# Patient Record
Sex: Female | Born: 1985 | Race: White | Hispanic: No | Smoking: Current every day smoker
Health system: Southern US, Community
[De-identification: ages and names within clinical notes are randomized; demographics above are authoritative.]

## PROBLEM LIST (undated history)

## (undated) ENCOUNTER — Inpatient Hospital Stay (HOSPITAL_COMMUNITY): Payer: Self-pay

## (undated) DIAGNOSIS — B009 Herpesviral infection, unspecified: Secondary | ICD-10-CM

## (undated) DIAGNOSIS — F329 Major depressive disorder, single episode, unspecified: Secondary | ICD-10-CM

## (undated) DIAGNOSIS — R87619 Unspecified abnormal cytological findings in specimens from cervix uteri: Secondary | ICD-10-CM

## (undated) DIAGNOSIS — F32A Depression, unspecified: Secondary | ICD-10-CM

## (undated) DIAGNOSIS — Z309 Encounter for contraceptive management, unspecified: Secondary | ICD-10-CM

## (undated) DIAGNOSIS — R87629 Unspecified abnormal cytological findings in specimens from vagina: Secondary | ICD-10-CM

## (undated) DIAGNOSIS — F419 Anxiety disorder, unspecified: Secondary | ICD-10-CM

## (undated) DIAGNOSIS — IMO0002 Reserved for concepts with insufficient information to code with codable children: Secondary | ICD-10-CM

## (undated) DIAGNOSIS — K219 Gastro-esophageal reflux disease without esophagitis: Secondary | ICD-10-CM

## (undated) DIAGNOSIS — J40 Bronchitis, not specified as acute or chronic: Secondary | ICD-10-CM

## (undated) HISTORY — PX: WISDOM TOOTH EXTRACTION: SHX21

## (undated) HISTORY — DX: Anxiety disorder, unspecified: F41.9

## (undated) HISTORY — DX: Unspecified abnormal cytological findings in specimens from vagina: R87.629

## (undated) HISTORY — DX: Encounter for contraceptive management, unspecified: Z30.9

---
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
## 2007-06-03 ENCOUNTER — Other Ambulatory Visit: Admission: RE | Admit: 2007-06-03 | Discharge: 2007-06-03 | Payer: Self-pay | Admitting: Obstetrics and Gynecology

## 2008-01-11 ENCOUNTER — Other Ambulatory Visit: Admission: RE | Admit: 2008-01-11 | Discharge: 2008-01-11 | Payer: Self-pay | Admitting: Obstetrics and Gynecology

## 2008-01-24 ENCOUNTER — Emergency Department (HOSPITAL_COMMUNITY): Admission: EM | Admit: 2008-01-24 | Discharge: 2008-01-24 | Payer: Self-pay | Admitting: Emergency Medicine

## 2008-09-16 ENCOUNTER — Emergency Department (HOSPITAL_COMMUNITY): Admission: EM | Admit: 2008-09-16 | Discharge: 2008-09-16 | Payer: Self-pay | Admitting: Emergency Medicine

## 2008-12-18 ENCOUNTER — Emergency Department (HOSPITAL_COMMUNITY): Admission: EM | Admit: 2008-12-18 | Discharge: 2008-12-18 | Payer: Self-pay | Admitting: Emergency Medicine

## 2009-01-09 ENCOUNTER — Other Ambulatory Visit: Admission: RE | Admit: 2009-01-09 | Discharge: 2009-01-09 | Payer: Self-pay | Admitting: Obstetrics and Gynecology

## 2009-02-07 ENCOUNTER — Emergency Department (HOSPITAL_COMMUNITY): Admission: EM | Admit: 2009-02-07 | Discharge: 2009-02-07 | Payer: Self-pay | Admitting: Emergency Medicine

## 2010-05-11 ENCOUNTER — Emergency Department (HOSPITAL_COMMUNITY)
Admission: EM | Admit: 2010-05-11 | Discharge: 2010-05-11 | Disposition: A | Discharge status: Home / Self Care | Payer: Medicaid Other | Attending: Emergency Medicine | Admitting: Emergency Medicine

## 2010-05-11 DIAGNOSIS — J020 Streptococcal pharyngitis: Secondary | ICD-10-CM | POA: Insufficient documentation

## 2010-05-11 LAB — RAPID STREP SCREEN (MED CTR MEBANE ONLY): Streptococcus, Group A Screen (Direct): POSITIVE — AB

## 2010-05-29 ENCOUNTER — Other Ambulatory Visit: Payer: Self-pay | Admitting: Adult Health

## 2010-05-29 ENCOUNTER — Other Ambulatory Visit (HOSPITAL_COMMUNITY)
Admission: RE | Admit: 2010-05-29 | Discharge: 2010-05-29 | Disposition: A | Discharge status: Home / Self Care | Payer: Medicaid Other | Source: Ambulatory Visit | Attending: Obstetrics and Gynecology | Admitting: Obstetrics and Gynecology

## 2010-05-29 DIAGNOSIS — Z01419 Encounter for gynecological examination (general) (routine) without abnormal findings: Secondary | ICD-10-CM | POA: Insufficient documentation

## 2010-05-29 DIAGNOSIS — Z113 Encounter for screening for infections with a predominantly sexual mode of transmission: Secondary | ICD-10-CM | POA: Insufficient documentation

## 2010-06-18 LAB — POCT I-STAT, CHEM 8
BUN: 9 mg/dL (ref 6–23)
Glucose, Bld: 95 mg/dL (ref 70–99)
HCT: 41 % (ref 36.0–46.0)
Sodium: 138 mEq/L (ref 135–145)

## 2010-06-22 LAB — URINALYSIS, ROUTINE W REFLEX MICROSCOPIC
Glucose, UA: NEGATIVE mg/dL
Ketones, ur: NEGATIVE mg/dL
Nitrite: NEGATIVE
Protein, ur: NEGATIVE mg/dL
Specific Gravity, Urine: 1.025 (ref 1.005–1.030)

## 2010-06-22 LAB — CBC
Hemoglobin: 13 g/dL (ref 12.0–15.0)
MCHC: 34.6 g/dL (ref 30.0–36.0)
RDW: 13.7 % (ref 11.5–15.5)
WBC: 9.1 10*3/uL (ref 4.0–10.5)

## 2010-06-22 LAB — DIFFERENTIAL: Basophils Absolute: 0.2 10*3/uL — ABNORMAL HIGH (ref 0.0–0.1)

## 2010-06-22 LAB — GLUCOSE, CAPILLARY: Glucose-Capillary: 91 mg/dL (ref 70–99)

## 2010-06-22 LAB — BASIC METABOLIC PANEL
BUN: 6 mg/dL (ref 6–23)
CO2: 28 mEq/L (ref 19–32)
Chloride: 105 mEq/L (ref 96–112)
GFR calc Af Amer: >60 mL/min (ref >60)
Sodium: 138 mEq/L (ref 135–145)

## 2010-06-22 LAB — PREGNANCY, URINE: Preg Test, Ur: NEGATIVE

## 2010-12-16 LAB — URINALYSIS, ROUTINE W REFLEX MICROSCOPIC
Bilirubin Urine: NEGATIVE
Ketones, ur: >80 — AB
Nitrite: NEGATIVE
Protein, ur: 30 — AB
Urobilinogen, UA: 0.2

## 2010-12-16 LAB — URINE MICROSCOPIC-ADD ON

## 2010-12-16 LAB — PREGNANCY, URINE: Preg Test, Ur: NEGATIVE

## 2011-01-02 LAB — OB RESULTS CONSOLE RUBELLA ANTIBODY, IGM: Rubella: IMMUNE

## 2011-01-02 LAB — OB RESULTS CONSOLE ABO/RH: RH Type: POSITIVE

## 2011-01-02 LAB — OB RESULTS CONSOLE RPR: RPR: NONREACTIVE

## 2011-01-18 ENCOUNTER — Emergency Department (HOSPITAL_COMMUNITY)
Admission: EM | Admit: 2011-01-18 | Discharge: 2011-01-18 | Disposition: A | Discharge status: Home / Self Care | Payer: Medicaid Other | Attending: Emergency Medicine | Admitting: Emergency Medicine

## 2011-01-18 DIAGNOSIS — M25559 Pain in unspecified hip: Secondary | ICD-10-CM | POA: Insufficient documentation

## 2011-01-18 DIAGNOSIS — O99891 Other specified diseases and conditions complicating pregnancy: Secondary | ICD-10-CM | POA: Insufficient documentation

## 2011-01-18 DIAGNOSIS — F17200 Nicotine dependence, unspecified, uncomplicated: Secondary | ICD-10-CM | POA: Insufficient documentation

## 2011-01-18 HISTORY — DX: Bronchitis, not specified as acute or chronic: J40

## 2011-01-18 MED ORDER — ACETAMINOPHEN 325 MG PO TABS
650.0000 mg | ORAL_TABLET | Freq: Once | ORAL | Status: AC
  Administered 2011-01-18: 650 mg via ORAL
  Filled 2011-01-18: qty 2

## 2011-01-18 NOTE — ED Notes (Signed)
States is one month and one week pregnant

## 2011-01-18 NOTE — ED Notes (Signed)
Pt presents with left sided hip pain. Pt states she is [redacted] weeks pregnant as well. Pt guarding left hip.

## 2011-01-18 NOTE — ED Provider Notes (Signed)
History     CSN: [id number] Arrival date & time: 01/18/2011 11:01 PM   First MD Initiated Contact with Patient 01/18/11 2304      Chief Complaint  Patient presents with  . Hip Pain    (Consider location/radiation/quality/duration/timing/severity/associated sxs/prior treatment) Patient is a 25 y.o. female presenting with hip pain. The history is provided by the patient.  Hip Pain This is a new problem. Episode onset: few days ago. The problem occurs constantly. The problem has not changed since onset.Pertinent negatives include no chest pain, no abdominal pain and no shortness of breath. The symptoms are aggravated by walking and standing. The symptoms are relieved by nothing.  pt c/o left hip pain for past few days. Constant. Dull. No hx same, x states in past hip occasionally would 'pop'. No hx hip condition/injury. No knee pain. No low back pain. No abdominal pain. No skin changes or erythema. No swelling. No numbness/weakness. No fever or chills. Has taken no meds for same. [redacted] weeks pregnant, +pnc, states normal u/s.   Past Medical History  Diagnosis Date  . Bronchitis     History reviewed. No pertinent past surgical history.  History reviewed. No pertinent family history.  History  Substance Use Topics  . Smoking status: Current Some Day Smoker  . Smokeless tobacco: Not on file  . Alcohol Use: No    OB History    Grav Para Term Preterm Abortions TAB SAB Ect Mult Living   1               Review of Systems  Constitutional: Negative for fever.  HENT: Negative for neck pain.   Respiratory: Negative for shortness of breath.   Cardiovascular: Negative for chest pain.  Gastrointestinal: Negative for abdominal pain.  Genitourinary: Negative for dysuria, flank pain and vaginal bleeding.  Musculoskeletal: Negative for back pain.  Skin: Negative for rash.  Neurological: Negative for weakness and numbness.  Hematological: Negative for adenopathy.    Allergies  Review of  patient's allergies indicates no known allergies.  Home Medications  No current outpatient prescriptions on file.  BP 118/63  Pulse 89  Temp(Src) 98.2 F (36.8 C) (Oral)  Resp 20  Ht 5\' 4"  (1.626 m)  Wt 135 lb (61.236 kg)  BMI 23.17 kg/m2  SpO2 100%  Physical Exam  Nursing note and vitals reviewed. Constitutional: She appears well-developed and well-nourished. No distress.  HENT:  Head: Atraumatic.  Eyes: Conjunctivae are normal. No scleral icterus.  Neck: Neck supple. No tracheal deviation present.  Cardiovascular: Normal rate.   Pulmonary/Chest: Effort normal. No respiratory distress.  Abdominal: Soft. Normal appearance. She exhibits no distension and no mass. There is no tenderness. There is no rebound and no guarding.  Musculoskeletal: She exhibits no edema.       Good rom at left hip and knee without pain. No sts. No focal bony tenderness. Distal pulses palp.   Lymphadenopathy:       No fem/inguinal l/a  Neurological: She is alert.       Motor intact. Steady gait  Skin: Skin is warm and dry. No rash noted.  Psychiatric: She has a normal mood and affect.    ED Course  Procedures (including critical care time)    MDM   No fever. Good rom without pain. Discussed diff dx w pt. Given early pregnancy and benign exam, no hx trauma, etc discussed feel no xray needed currently. Try tylenol prn, rest, plan recheck 2-3 days if symptoms fail to improve/resolve (  sooner if worse, severe pain, fevers, swelling).   Suzi Roots, MD 01/18/11 2325

## 2011-01-19 ENCOUNTER — Encounter (HOSPITAL_COMMUNITY): Payer: Self-pay | Admitting: Emergency Medicine

## 2011-01-19 ENCOUNTER — Emergency Department (HOSPITAL_COMMUNITY)
Admission: EM | Admit: 2011-01-19 | Discharge: 2011-01-20 | Disposition: A | Discharge status: Home / Self Care | Payer: Medicaid Other | Attending: Emergency Medicine | Admitting: Emergency Medicine

## 2011-01-19 DIAGNOSIS — M25559 Pain in unspecified hip: Secondary | ICD-10-CM | POA: Insufficient documentation

## 2011-01-19 DIAGNOSIS — O2340 Unspecified infection of urinary tract in pregnancy, unspecified trimester: Secondary | ICD-10-CM

## 2011-01-19 DIAGNOSIS — R109 Unspecified abdominal pain: Secondary | ICD-10-CM | POA: Insufficient documentation

## 2011-01-19 DIAGNOSIS — O2390 Unspecified genitourinary tract infection in pregnancy, unspecified trimester: Secondary | ICD-10-CM | POA: Insufficient documentation

## 2011-01-19 DIAGNOSIS — O210 Mild hyperemesis gravidarum: Secondary | ICD-10-CM | POA: Insufficient documentation

## 2011-01-19 DIAGNOSIS — O99891 Other specified diseases and conditions complicating pregnancy: Secondary | ICD-10-CM | POA: Insufficient documentation

## 2011-01-19 DIAGNOSIS — N390 Urinary tract infection, site not specified: Secondary | ICD-10-CM | POA: Insufficient documentation

## 2011-01-19 LAB — URINALYSIS, ROUTINE W REFLEX MICROSCOPIC
Bilirubin Urine: NEGATIVE
Glucose, UA: NEGATIVE mg/dL
Ketones, ur: NEGATIVE mg/dL
Nitrite: POSITIVE — AB
Urobilinogen, UA: 0.2 mg/dL (ref 0.0–1.0)

## 2011-01-19 LAB — URINE MICROSCOPIC-ADD ON

## 2011-01-19 LAB — WET PREP, GENITAL: Trich, Wet Prep: NONE SEEN

## 2011-01-19 LAB — POCT PREGNANCY, URINE: Preg Test, Ur: POSITIVE

## 2011-01-19 MED ORDER — CEPHALEXIN 500 MG PO CAPS: 500.0000 mg | ORAL_CAPSULE | Freq: Four times a day (QID) | ORAL | Status: AC

## 2011-01-19 MED ORDER — CEPHALEXIN 500 MG PO CAPS
500.0000 mg | ORAL_CAPSULE | Freq: Once | ORAL | Status: AC
  Administered 2011-01-20: 500 mg via ORAL
  Filled 2011-01-19: qty 1

## 2011-01-19 MED ORDER — ACETAMINOPHEN 325 MG PO TABS
650.0000 mg | ORAL_TABLET | Freq: Once | ORAL | Status: AC
  Administered 2011-01-20: 650 mg via ORAL
  Filled 2011-01-19: qty 2

## 2011-01-19 NOTE — ED Notes (Signed)
Pt states she is [redacted] weeks pregnant and started cramping this evening.

## 2011-01-19 NOTE — ED Notes (Signed)
Set up pelvic exam and changed bed out for pelvic bed, notified Dr.

## 2011-01-19 NOTE — ED Provider Notes (Signed)
History  Scribed for Tracy Gaskins, MD, the patient was seen in room APA01. This chart was scribed by Hillery Hunter.   CSN: [id number] Arrival date & time: 01/19/2011 10:02 PM   First MD Initiated Contact with Patient 01/19/11 2215      Chief Complaint  Patient presents with  . Abdominal Cramping    Patient is a 25 y.o. female presenting with cramps. The history is provided by the patient.  Abdominal Cramping The primary symptoms of the illness include abdominal pain (cramping), vomiting and vaginal discharge (during pregnancy). The primary symptoms of the illness do not include fever, shortness of breath, dysuria or vaginal bleeding.  The vaginal discharge is not associated with dysuria.  Symptoms associated with the illness do not include hematuria or back pain.   Tracy LUNDBLAD is a 25 y.o. female who presents to the Emergency Department complaining of abdominal cramping with current pregnancy. She states that she is [redacted] weeks pregnant with her first pregnancy and began experiencing abdominal cramping about 3.5 hours ago. She states the pain began in her lower abdomen and has moved superiorly to periumbilical region. She denies associated vaginal bleeding, dysuria and fever. She sees [facility] and had an ultrasound 3 weeks ago that showed live IUP. She denies prior abdominal surgeries and has a medical history only significant for bronchitis.  Time seen: 22:26   Past Medical History  Diagnosis Date  . Bronchitis      History  Substance Use Topics  . Smoking status: Current Some Day Smoker  . Smokeless tobacco: Not on file  . Alcohol Use: No    OB History    Grav Para Term Preterm Abortions TAB SAB Ect Mult Living   1               Review of Systems  Constitutional: Negative for fever.  Respiratory: Negative for shortness of breath.   Cardiovascular: Negative for chest pain.  Gastrointestinal: Positive for vomiting and  abdominal pain (cramping).  Genitourinary: Positive for vaginal discharge (during pregnancy). Negative for dysuria, hematuria and vaginal bleeding.  Musculoskeletal: Negative for back pain.       Pain in left hip for which she was seen here yesterday and is still present  All other systems reviewed and are negative.    Allergies  Review of patient's allergies indicates no known allergies.  Home Medications  No current outpatient prescriptions on file.  Triage Vitals: BP 108/53  Pulse 76  Temp(Src) 97.9 F (36.6 C) (Oral)  Resp 20  Ht 5\' 5"  (1.651 m)  Wt 136 lb (61.689 kg)  BMI 22.63 kg/m2  SpO2 100%  Physical Exam  CONSTITUTIONAL: Well developed/well nourished HEAD AND FACE: Normocephalic/atraumatic EYES: EOMI/PERRL ENMT: Mucous membranes moist NECK: supple no meningeal signs CV: S1/S2 noted, no murmurs/rubs/gallops noted LUNGS: Lungs are clear to auscultation bilaterally, no apparent distress ABDOMEN: soft, nontender, no rebound or guarding, abdominal ultrasound at bedside shows live IUP: 9 weeks 1 day, fetal heart tones present GU:no cva tenderness, no vaginal discharge, no vaginal bleeding, no cmt, chaperone present NEURO: Pt is awake/alert, moves all extremitiesx4 EXTREMITIES: pulses normal, full ROM,no tenderness noted SKIN: warm, color normal PSYCH: no abnormalities of mood noted   ED Course  [place] bedside Performed by: Tracy Choi Authorized by: Tracy Choi Consent: Verbal consent obtained. Risks and benefits: risks, benefits and alternatives were discussed Consent given by: patient Patient understanding: patient states understanding of the procedure being performed Patient  identity confirmed: verbally with patient and arm band Time out: Immediately prior to procedure a "time out" was called to verify the correct patient, procedure, equipment, support staff and site/side marked as required. Patient tolerance: Patient tolerated the procedure well  with no immediate complications. Comments: Transabdominal Ultrasound Indication - evaluate for IUP, abdominal pain +IUP noted with +cardiac activity CRL shows 9 weeks 1 day         Labs Reviewed  URINALYSIS, ROUTINE W REFLEX MICROSCOPIC  POCT PREGNANCY, URINE   Results for orders placed during the hospital encounter of 01/19/11  URINALYSIS, ROUTINE W REFLEX MICROSCOPIC      Component Value Range   Color, Urine YELLOW  YELLOW    Appearance CLEAR  CLEAR    Specific Gravity, Urine 1.025  1.005 - 1.030    pH 6.0  5.0 - 8.0    Glucose, UA NEGATIVE  NEGATIVE (mg/dL)   Hgb urine dipstick NEGATIVE  NEGATIVE    Bilirubin Urine NEGATIVE  NEGATIVE    Ketones, ur NEGATIVE  NEGATIVE (mg/dL)   Protein, ur NEGATIVE  NEGATIVE (mg/dL)   Urobilinogen, UA 0.2  0.0 - 1.0 (mg/dL)   Nitrite POSITIVE (*) NEGATIVE    Leukocytes, UA NEGATIVE  NEGATIVE   POCT PREGNANCY, URINE      Component Value Range   Preg Test, Ur POSITIVE    URINE MICROSCOPIC-ADD ON      Component Value Range   Squamous Epithelial / LPF FEW (*) RARE    WBC, UA 3-6  <3 (WBC/hpf)   Bacteria, UA MANY (*) RARE    Urine-Other AMORPHOUS URATES/PHOSPHATES        MDM  Nursing notes reviewed and considered in documentation All labs/vitals reviewed and considered Previous records reviewed and considered  Pt well appearing, abd soft and no focal tenderness Pt reports she is approximately 10 wks by [place] by her OBGYN and by due date Bedside US here confirms this Suspicion for acute abd/gyn process is low She has f/u in 24 hours Will start abx for uti   I personally performed the services described in this documentation, which was scribed in my presence. The recorded information has been reviewed and considered.        Tracy Gaskins, MD 01/19/11 [phone]

## 2011-01-21 LAB — GC/CHLAMYDIA PROBE AMP, GENITAL
Chlamydia, DNA Probe: NEGATIVE
GC Probe Amp, Genital: NEGATIVE

## 2011-03-17 NOTE — L&D Delivery Note (Cosign Needed)
Delivery Note At 8:14 AM a viable female was delivered via Vaginal, Spontaneous Delivery (Presentation: Right Occiput Anterior).  APGAR: 9, 9; weight pending. Placenta status: Intact, Spontaneous, calcification.  Cord: 3 vessels with the following complications: None  Anesthesia: Epidural  Episiotomy: None Lacerations: Diffuse skin tears Suture Repair: 3.0 vicryl placed for point bleeding Est. Blood Loss (mL): 300  Mom to postpartum.  Baby to nursery-stable.  Clancy Gourd 08/27/2011, 8:34 AM  Supervised by Dorathy Kinsman

## 2011-03-26 ENCOUNTER — Emergency Department (HOSPITAL_COMMUNITY)
Admission: EM | Admit: 2011-03-26 | Discharge: 2011-03-26 | Disposition: A | Discharge status: Home / Self Care | Payer: Medicaid Other | Attending: Emergency Medicine | Admitting: Emergency Medicine

## 2011-03-26 ENCOUNTER — Encounter (HOSPITAL_COMMUNITY): Payer: Self-pay | Admitting: Emergency Medicine

## 2011-03-26 DIAGNOSIS — R109 Unspecified abdominal pain: Secondary | ICD-10-CM | POA: Insufficient documentation

## 2011-03-26 DIAGNOSIS — O9989 Other specified diseases and conditions complicating pregnancy, childbirth and the puerperium: Secondary | ICD-10-CM | POA: Insufficient documentation

## 2011-03-26 LAB — URINE CULTURE
Colony Count: NO GROWTH
Culture  Setup Time: 201301110321

## 2011-03-26 LAB — URINALYSIS, ROUTINE W REFLEX MICROSCOPIC
Glucose, UA: NEGATIVE mg/dL
Leukocytes, UA: NEGATIVE
Nitrite: NEGATIVE
Specific Gravity, Urine: >1.03 — ABNORMAL HIGH (ref 1.005–1.030)
pH: 6 (ref 5.0–8.0)

## 2011-03-26 MED ORDER — ACETAMINOPHEN 325 MG PO TABS
650.0000 mg | ORAL_TABLET | Freq: Once | ORAL | Status: AC
  Administered 2011-03-26: 650 mg via ORAL
  Filled 2011-03-26: qty 2
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
## 2011-03-26 NOTE — Discharge Instructions (Signed)
Abdominal (belly) pain can be caused by many things. any cases can be observed and treated at home after initial evaluation in the emergency department. Even though you are being discharged home, abdominal pain can be unpredictable. Therefore, you need a repeated exam if your pain does not resolve, returns, or worsens. Most patients with abdominal pain don't have to be admitted to the hospital or have surgery, but serious problems like appendicitis and gallbladder attacks can start out as nonspecific pain. Many abdominal conditions cannot be diagnosed in one visit, so follow-up evaluations are very important. SEEK IMMEDIATE MEDICAL ATTENTION IF: The pain does not go away or becomes severe, particularly over the next 8-12 hours.  A temperature above 100.60F develops.  Repeated vomiting occurs (multiple episodes).  The pain becomes localized to portions of the abdomen. The right side could possibly be appendicitis. In an adult, the left lower portion of the abdomen could be colitis or diverticulitis.  Blood is being passed in stools or vomit (bright red or black tarry stools).  Return also if you develop chest pain, difficulty breathing, dizziness or fainting, or become confused, poorly responsive, or inconsolable (young children).

## 2011-07-29 ENCOUNTER — Inpatient Hospital Stay (HOSPITAL_COMMUNITY): Payer: Medicaid Other

## 2011-07-29 ENCOUNTER — Encounter (HOSPITAL_COMMUNITY): Payer: Self-pay | Admitting: *Deleted

## 2011-07-29 ENCOUNTER — Observation Stay (HOSPITAL_COMMUNITY)
Admission: AD | Admit: 2011-07-29 | Discharge: 2011-07-30 | Disposition: A | Discharge status: Home / Self Care | Payer: Medicaid Other | Source: Ambulatory Visit | Attending: Obstetrics & Gynecology | Admitting: Obstetrics & Gynecology

## 2011-07-29 DIAGNOSIS — O4693 Antepartum hemorrhage, unspecified, third trimester: Secondary | ICD-10-CM | POA: Diagnosis present

## 2011-07-29 DIAGNOSIS — O4690 Antepartum hemorrhage, unspecified, unspecified trimester: Secondary | ICD-10-CM

## 2011-07-29 LAB — CBC
HCT: 35.3 % — ABNORMAL LOW (ref 36.0–46.0)
MCH: 29.3 pg (ref 26.0–34.0)
MCV: 88.3 fL (ref 78.0–100.0)
Platelets: 162 10*3/uL (ref 150–400)
RDW: 13.6 % (ref 11.5–15.5)
WBC: 12.2 10*3/uL — ABNORMAL HIGH (ref 4.0–10.5)

## 2011-07-29 MED ORDER — PRENATAL MULTIVITAMIN CH: 1.0000 | ORAL_TABLET | Freq: Every day | ORAL | Status: DC

## 2011-07-29 MED ORDER — ACETAMINOPHEN 325 MG PO TABS
650.0000 mg | ORAL_TABLET | ORAL | Status: DC | PRN
  Administered 2011-07-29: 650 mg via ORAL
  Filled 2011-07-29: qty 2

## 2011-07-29 MED ORDER — DOCUSATE SODIUM 100 MG PO CAPS: 100.0000 mg | ORAL_CAPSULE | Freq: Every day | ORAL | Status: DC

## 2011-07-29 MED ORDER — ZOLPIDEM TARTRATE 10 MG PO TABS: 10.0000 mg | ORAL_TABLET | Freq: Every evening | ORAL | Status: DC | PRN

## 2011-07-29 MED ORDER — CALCIUM CARBONATE ANTACID 500 MG PO CHEW
2.0000 | CHEWABLE_TABLET | ORAL | Status: DC | PRN
  Administered 2011-07-30: 400 mg via ORAL
  Filled 2011-07-29: qty 2

## 2011-07-29 MED ORDER — PANTOPRAZOLE SODIUM 40 MG PO TBEC
40.0000 mg | DELAYED_RELEASE_TABLET | Freq: Every day | ORAL | Status: DC
  Administered 2011-07-29: 40 mg via ORAL
  Filled 2011-07-29 (×2): qty 1

## 2011-07-29 MED ORDER — ACYCLOVIR 200 MG PO CAPS
400.0000 mg | ORAL_CAPSULE | Freq: Two times a day (BID) | ORAL | Status: DC
  Administered 2011-07-29: 400 mg via ORAL
  Filled 2011-07-29 (×2): qty 2

## 2011-07-29 NOTE — MAU Provider Note (Signed)
Chief Complaint:  Vaginal Bleeding   First Provider Initiated Contact with Patient 07/29/11 1457      HPI  Tracy Choi is a 26 y.o. G1P0 at [redacted]w[redacted]d presenting with bright red vaginal bleeding starting following intercourse this morning and increasing over the day.  She reports good fetal movement, denies LOF, vaginal itching/burning, urinary symptoms, h/a, dizziness, n/v, or fever/chills.    Pregnancy Course: uncomplicated  Past Medical History: Past Medical History  Diagnosis Date  . Bronchitis   . No pertinent past medical history     Past Surgical History: Past Surgical History  Procedure Date  . No past surgeries     Family History: Family History  Problem Relation Age of Onset  . Anesthesia problems Neg Hx     Social History: History  Substance Use Topics  . Smoking status: Current Some Day Smoker    Types: Cigarettes  . Smokeless tobacco: Not on file  . Alcohol Use: No    Allergies:  Allergies  Allergen Reactions  . Peanut-Containing Drug Products Anaphylaxis  . Vicodin (Hydrocodone-Acetaminophen) Nausea And Vomiting    Patient states that she gets sick to her stomach when medicine is "getting out of her system"    Meds:  Prescriptions prior to admission  Medication Sig Dispense Refill  . acyclovir (ZOVIRAX) 400 MG tablet Take 400 mg by mouth 2 (two) times daily.      Marland Kitchen albuterol (VENTOLIN HFA) 108 (90 BASE) MCG/ACT inhaler Inhale 2 puffs into the lungs every 6 (six) hours as needed. For asthmatic bronchitis      . omeprazole (PRILOSEC) 20 MG capsule Take 20 mg by mouth at bedtime.      . Prenatal Vit-Fe Fumarate-FA (PRENATAL MULTIVITAMIN) TABS Take 1 tablet by mouth every morning.          Physical Exam  Blood pressure 114/64, pulse 103, resp. rate 18, height 5\' 5"  (1.651 m), weight 73.483 kg (162 lb). GENERAL: Well-developed, well-nourished female in no acute distress.  HEENT: normocephalic, good dentition HEART: normal rate RESP: normal  effort ABDOMEN: Soft, nontender, nondistended, gravid.  EXTREMITIES: Nontender, no edema NEURO: alert and oriented  SPECULUM EXAM: Pelvic exam: Cervix pink, without lesion, scant brown discharge, no active bleeding seen, vaginal walls and external genitalia without lesion Cervix 1/40/-3, soft, anterior  Dilation: 1 Effacement (%): 40 Cervical Position: Anterior Station: -2 Presentation: Vertex Exam by:: Sharen Counter, CNM  No blood on glove following cervical exam  FHT:  Baseline 135 , moderate variability, accelerations present, no decelerations Contractions: occasional irritability   Labs: Results for orders placed during the hospital encounter of 07/29/11 (from the past 24 hour(s))  CBC     Status: Abnormal   Collection Time   07/29/11  4:41 PM      Component Value Range   WBC 12.2 (*) 4.0 - 10.5 (K/uL)   RBC 4.00  3.87 - 5.11 (MIL/uL)   Hemoglobin 11.7 (*) 12.0 - 15.0 (g/dL)   HCT 40.9 (*) 81.1 - 46.0 (%)   MCV 88.3  78.0 - 100.0 (fL)   MCH 29.3  26.0 - 34.0 (pg)   MCHC 33.1  30.0 - 36.0 (g/dL)   RDW 91.4  78.2 - 95.6 (%)   Platelets 162  150 - 400 (K/uL)     Imaging:    Assessment: Vaginal bleeding in pregnancy   Plan: Discussed assessment with Dr Debroah Loop Limited U/S to evaluate placenta and for AFI  Admit for observation to antepartum See H&P  LEFTWICH-KIRBY,  Meri Pelot 5/15/20133:15 PM

## 2011-07-29 NOTE — H&P (Signed)
Tracy Choi is a 26 y.o. female presenting with vaginal bleeding at term.  She reports spotting after intercourse this morning, then heavier bleeding, soaking a pad with bright red blood before coming in to MAU and arriving in MAU with pad 1/2 soaked with bright red blood.  She reports good fetal movement, denies regular contractions, LOF, vaginal itching/burning, urinary symptoms, h/a, dizziness, n/v, or fever/chills.    Maternal Medical History:  Reason for admission: Reason for admission: vaginal bleeding.  Reason for Admission:   nauseaFetal activity: Perceived fetal activity is normal.   Last perceived fetal movement was within the past hour.    Prenatal complications: Bleeding.   Prenatal Complications - Diabetes: none. 2 hour GTT 105, 94, 91  OB History    Grav Para Term Preterm Abortions TAB SAB Ect Mult Living   1 0 0 0 0 0 0 0 0 0      Past Medical History  Diagnosis Date  . Bronchitis   . No pertinent past medical history    Past Surgical History  Procedure Date  . No past surgeries    Family History: family history is negative for Anesthesia problems. Social History:  reports that she has been smoking Cigarettes.  She does not have any smokeless tobacco history on file. She reports that she does not drink alcohol or use illicit drugs.  Review of Systems  Constitutional: Negative for fever, chills and malaise/fatigue.  Eyes: Negative for blurred vision.  Respiratory: Negative for cough and shortness of breath.   Cardiovascular: Negative for chest pain.  Gastrointestinal: Negative for heartburn, nausea, vomiting and abdominal pain.  Genitourinary: Negative for dysuria, urgency and frequency.  Musculoskeletal: Negative.   Neurological: Negative for dizziness and headaches.  Psychiatric/Behavioral: Negative for depression.     SPECULUM EXAM: Pelvic exam: Cervix pink, without lesion, scant brown discharge, no active bleeding seen, vaginal walls and external  genitalia without lesion Cervix 1/40/-3, soft, anterior  No bleeding on glove following cervical exam Dilation: 1 Effacement (%): 40 Station: -2 Exam by:: Sharen Counter, CNM Blood pressure 114/64, pulse 103, resp. rate 18, height 5\' 5"  (1.651 m), weight 73.483 kg (162 lb). Maternal Exam:  Uterine Assessment: Contraction strength is mild.  Contraction frequency is irregular.   Abdomen: Patient reports no abdominal tenderness. Fetal presentation: vertex  Pelvis: adequate for delivery.   Cervix: Cervix evaluated by digital exam.     Fetal Exam Fetal Monitor Review: Mode: ultrasound.   Baseline rate: 135.  Variability: moderate (6-25 bpm).   Pattern: accelerations present and no decelerations.    Fetal State Assessment: Category I - tracings are normal.     Physical Exam  Nursing note and vitals reviewed. Constitutional: She is oriented to person, place, and time. She appears well-developed and well-nourished.  Neck: Normal range of motion.  Cardiovascular: Normal rate, regular rhythm and normal heart sounds.   Respiratory: Effort normal and breath sounds normal.  GI: Soft.  Musculoskeletal: Normal range of motion.  Neurological: She is alert and oriented to person, place, and time. She has normal reflexes.  Skin: Skin is warm and dry.  Psychiatric: She has a normal mood and affect. Her behavior is normal. Judgment and thought content normal.    Prenatal labs: ABO, Rh:  O pos Antibody:  neg Rubella:  immune RPR:   neg HBsAg:   neg HIV:   neg GBS:   unknown--collected 07/28/11  Assessment/Plan: Vaginal bleeding at term  Admit to antepartum for observation per Dr Debroah Loop Continuous  fetal monitoring/toco  LEFTWICH-KIRBY, Seraphina Mitchner 07/29/2011, 5:03 PM

## 2011-07-29 NOTE — MAU Note (Signed)
Pt in due to vaginal bleeding. Had intercourse this am and started bleeding during intercourse around 1000.  Has continued to bleed, saturated 1 pad at home, 1 seen in mau had moderate amount of darker red blood on pad.  New pad given in triage.  Reports abdominal cramping that started with bleeding.  + FM

## 2011-07-30 NOTE — Discharge Summary (Signed)
Obstetric Discharge Summary Reason for Admission: bleeding Prenatal Procedures: none Intrapartum Procedures: na Postpartum Procedures: none Complications-Operative and Postpartum: none Hemoglobin  Date Value Range Status  07/29/2011 11.7* 12.0-15.0 (g/dL) Final     HCT  Date Value Range Status  07/29/2011 35.3* 36.0-46.0 (%) Final    Physical Exam:  General: alert and cooperative Uterine Fundus: non tender DVT Evaluation: No evidence of DVT seen on physical exam.  Discharge Diagnoses: Antepartum bleeding  Discharge Information: Date: 07/30/2011 Activity: pelvic rest Diet: routine Medications: None Condition: stable Instructions: refer to practice specific booklet Discharge to: home Follow-up Information    Follow up with [facility] on 08/04/2011.   Contact information:   [address] [place] [address]          Lazaro Arms 07/30/2011, 7:39 AM

## 2011-07-30 NOTE — Discharge Instructions (Signed)
Vaginal Bleeding During Pregnancy, Third Trimester A small amount of bleeding (spotting) is relatively common in pregnancy. Sometimes bleeding may be "normal." Bleeding in the third trimester can be very serious for the mother and the baby, and should be reported to your caregiver right away. It is very important to follow your caregiver's instructions. CAUSES Possible causes of bleeding during the third trimester:  The placenta may be partially covering or completely covering the opening to the cervix (placenta previa).   The placenta may have separated from the uterus (abruption of the placenta).   There may be an infection or growth on the cervix.   You may be starting labor, called discharging of the mucus plug.   The placenta may grow into the muscle layer of the uterus (placenta accreta).  DIAGNOSIS  Your caregiver may do:  Pelvic exam in the delivery room, called a double set up (being ready to do an emergency cesarean delivery, if necessary).   Blood tests, to see if you are anemic (not having enough red blood cells).   Ultrasound and fetal monitoring, to see if the baby is having problems.   Ultrasound, to find out why you are bleeding.  TREATMENT   You may need to remain in the hospital.   You may be given an IV (intravenous) while you are in the hospital.   You may be put on strict bed rest.   You may need a blood transfusion.   You may be placed on oxygen.   The baby may need to be delivered immediately.  HOME CARE INSTRUCTIONS   Your caregiver may order bed rest (getting up to go to the bathroom only). At this time, you may need to make arrangements for the care of children and for other responsibilities.   Keep track of the number of pads you use each day and how soaked (saturated) they are. Write this down.   Do not use tampons. Do not douche.   Do not have sexual intercourse or any sexual activity that may cause an orgasm, until approved by your caregiver.    Follow your caregiver's advice about lifting, driving, physical and social activities.   Eat a balanced and nutritious diet.   Get plenty of rest and sleep.   Do not drink alcohol or smoke.  SEEK IMMEDIATE MEDICAL CARE IF:   You experience severe cramps or pain in your back or belly (abdomen).   You have an oral temperature above 102 F (38.9 C), not controlled by medicine.   You develop chills.   You have a gush of fluid from the vagina.   You pass large clots or tissue. Save any tissue for your caregiver to inspect.   Your bleeding increases or you become light-headed or weak.   You pass out.   You feel less movement or no movement of the baby.  Document Released: 05/23/2002 Document Revised: 02/19/2011 Document Reviewed: 01/28/2009 Exit[employer] Patient Information 2012 [employer]. 

## 2011-08-01 NOTE — H&P (Signed)
Agree with note. 

## 2011-08-03 NOTE — Progress Notes (Signed)
Post discharge review completed. 

## 2011-08-09 ENCOUNTER — Inpatient Hospital Stay (HOSPITAL_COMMUNITY)
Admission: AD | Admit: 2011-08-09 | Discharge: 2011-08-09 | Disposition: A | Discharge status: Hospice | Payer: Medicaid Other | Source: Ambulatory Visit | Attending: Obstetrics & Gynecology | Admitting: Obstetrics & Gynecology

## 2011-08-09 ENCOUNTER — Encounter (HOSPITAL_COMMUNITY): Payer: Self-pay | Admitting: *Deleted

## 2011-08-09 DIAGNOSIS — O479 False labor, unspecified: Secondary | ICD-10-CM | POA: Insufficient documentation

## 2011-08-09 HISTORY — DX: Major depressive disorder, single episode, unspecified: F32.9

## 2011-08-09 HISTORY — DX: Gastro-esophageal reflux disease without esophagitis: K21.9

## 2011-08-09 HISTORY — DX: Depression, unspecified: F32.A

## 2011-08-09 HISTORY — DX: Reserved for concepts with insufficient information to code with codable children: IMO0002

## 2011-08-09 HISTORY — DX: Herpesviral infection, unspecified: B00.9

## 2011-08-09 HISTORY — DX: Unspecified abnormal cytological findings in specimens from cervix uteri: R87.619

## 2011-08-09 NOTE — MAU Note (Signed)
Pt states her contractions started @ 1900 and are anywhere from 10-20 min apart and rates them a "5" on the pain scale.  Denies any bleeding or leaking.

## 2011-08-09 NOTE — Discharge Instructions (Signed)

## 2011-08-21 ENCOUNTER — Telehealth (HOSPITAL_COMMUNITY): Payer: Self-pay | Admitting: *Deleted

## 2011-08-21 ENCOUNTER — Encounter (HOSPITAL_COMMUNITY): Payer: Self-pay | Admitting: *Deleted

## 2011-08-21 NOTE — Telephone Encounter (Signed)
Preadmission screen  

## 2011-08-24 ENCOUNTER — Encounter: Payer: Self-pay | Admitting: Obstetrics and Gynecology

## 2011-08-24 ENCOUNTER — Other Ambulatory Visit: Payer: Self-pay | Admitting: Obstetrics and Gynecology

## 2011-08-24 NOTE — H&P (Signed)
  Tracy Choi is a 26 y.o. female presenting for Induction of labor due to postdates pregnancy. History OB History    Grav Para Term Preterm Abortions TAB SAB Ect Mult Living   1 0 0 0 0 0 0 0 0 0      Past Medical History  Diagnosis Date  . Bronchitis   . No pertinent past medical history   . Herpes     do not let any family members know of the HSV  . Depression   . Asthma   . GERD (gastroesophageal reflux disease)   . Abnormal Pap smear    Past Surgical History  Procedure Date  . No past surgeries   . Wisdom tooth extraction    Family History: family history includes Alcohol abuse in her mother; Cancer in her mother and paternal grandmother; Diabetes in her maternal uncle; Heart disease in her maternal grandfather, maternal grandmother, and maternal uncle; Hypertension in her maternal aunt, maternal grandfather, maternal grandmother, maternal uncle, mother, paternal aunt, paternal grandfather, paternal grandmother, and paternal uncle; and Stroke in her maternal grandfather and maternal grandmother.  There is no history of Anesthesia problems. Social History:  reports that she has been smoking Cigarettes.  She has a 5 pack-year smoking history. She has quit using smokeless tobacco. She reports that she does not drink alcohol or use illicit drugs.  ROSno acute concerns    There were no vitals taken for this visit. Exam Physical Exam Physical Examination: General appearance - alert, well appearing, and in no distress, oriented to person, place, and time, normal appearing weight and BP 142/88, Wt 160.6 lb(22.6 lb gain this pregnancy) Mental status - alert, oriented to person, place, and time, normal mood, behavior, speech, dress, motor activity, and thought processes Chest - clear to auscultation, no wheezes, rales or rhonchi, symmetric air entry Heart - normal rate and regular rhythm Abdomen - soft, nontender, nondistended, no masses or organomegaly Gravid uterus 36-37 cm  fundal Ht. Pelvic - normal external genitalia, vulva, vagina, cervix, uterus and adnexa, CERVIX: normal appearing cervix without discharge or lesions, Cx: 2.5/80/soft/vtx/ -2-3 , UTERUS: enlarged to 40 week's size Extremities - peripheral pulses normal, no pedal edema, no clubbing or cyanosis, pedal edema 1 +  Prenatal labs: ABO, Rh: --/--/O POS (05/15 1647) Antibody: Negative (10/19 0000) Rubella: Immune (10/19 0000) RPR: Nonreactive (10/19 0000)  HBsAg: Negative (10/19 0000)  HIV: Non-reactive (10/19 0000)  GBS: Negative (05/15 0000)  Down synd Risk initially considered Increased with DSR 1:52, with [facility] Testing less than 1:`10,000 for T-21, T-18, T-13. Assessment/Plan: Pregnancy 41.0wks, admitted for Induction of Labor for postdates.   Myron Stankovich V 08/24/2011, 10:57 PM

## 2011-08-25 ENCOUNTER — Inpatient Hospital Stay (HOSPITAL_COMMUNITY)
Admission: RE | Admit: 2011-08-25 | Discharge: 2011-08-28 | DRG: 775 | Disposition: A | Discharge status: Home / Self Care | Payer: Medicaid Other | Source: Ambulatory Visit | Attending: Family Medicine | Admitting: Family Medicine

## 2011-08-25 ENCOUNTER — Encounter (HOSPITAL_COMMUNITY): Payer: Self-pay

## 2011-08-25 DIAGNOSIS — O480 Post-term pregnancy: Principal | ICD-10-CM | POA: Diagnosis present

## 2011-08-25 LAB — CBC
MCV: 85.7 fL (ref 78.0–100.0)
Platelets: 211 10*3/uL (ref 150–400)
RBC: 4.68 MIL/uL (ref 3.87–5.11)
WBC: 14.3 10*3/uL — ABNORMAL HIGH (ref 4.0–10.5)

## 2011-08-25 MED ORDER — OXYTOCIN 20 UNITS IN LACTATED RINGERS INFUSION - SIMPLE
1.0000 m[IU]/min | INTRAVENOUS | Status: DC
  Filled 2011-08-25: qty 1000

## 2011-08-25 MED ORDER — LACTATED RINGERS IV SOLN
500.0000 mL | INTRAVENOUS | Status: DC | PRN
  Administered 2011-08-27: 500 mL via INTRAVENOUS

## 2011-08-25 MED ORDER — TERBUTALINE SULFATE 1 MG/ML IJ SOLN: 0.2500 mg | Freq: Once | INTRAMUSCULAR | Status: AC | PRN

## 2011-08-25 MED ORDER — OXYTOCIN BOLUS FROM INFUSION
500.0000 mL | Freq: Once | INTRAVENOUS | Status: AC
  Administered 2011-08-27: 500 mL via INTRAVENOUS
  Filled 2011-08-25: qty 500

## 2011-08-25 MED ORDER — LIDOCAINE HCL (PF) 1 % IJ SOLN
30.0000 mL | INTRAMUSCULAR | Status: DC | PRN
  Filled 2011-08-25: qty 30

## 2011-08-25 MED ORDER — ONDANSETRON HCL 4 MG/2ML IJ SOLN: 4.0000 mg | Freq: Four times a day (QID) | INTRAMUSCULAR | Status: DC | PRN

## 2011-08-25 MED ORDER — ACETAMINOPHEN 325 MG PO TABS: 650.0000 mg | ORAL_TABLET | ORAL | Status: DC | PRN

## 2011-08-25 MED ORDER — CITRIC ACID-SODIUM CITRATE 334-500 MG/5ML PO SOLN: 30.0000 mL | ORAL | Status: DC | PRN

## 2011-08-25 MED ORDER — OXYCODONE-ACETAMINOPHEN 5-325 MG PO TABS: 1.0000 | ORAL_TABLET | ORAL | Status: DC | PRN

## 2011-08-25 MED ORDER — FLEET ENEMA 7-19 GM/118ML RE ENEM: 1.0000 | ENEMA | RECTAL | Status: DC | PRN

## 2011-08-25 MED ORDER — IBUPROFEN 600 MG PO TABS: 600.0000 mg | ORAL_TABLET | Freq: Four times a day (QID) | ORAL | Status: DC | PRN

## 2011-08-25 MED ORDER — OXYTOCIN 20 UNITS IN LACTATED RINGERS INFUSION - SIMPLE: 125.0000 mL/h | Freq: Once | INTRAVENOUS | Status: DC

## 2011-08-25 MED ORDER — ZOLPIDEM TARTRATE 10 MG PO TABS
10.0000 mg | ORAL_TABLET | Freq: Every evening | ORAL | Status: DC | PRN
  Administered 2011-08-25: 10 mg via ORAL
  Filled 2011-08-25: qty 1

## 2011-08-25 MED ORDER — MISOPROSTOL 25 MCG QUARTER TABLET
25.0000 ug | ORAL_TABLET | ORAL | Status: DC | PRN
  Administered 2011-08-25 – 2011-08-26 (×3): 25 ug via VAGINAL
  Filled 2011-08-25: qty 0.25
  Filled 2011-08-25: qty 1
  Filled 2011-08-25 (×3): qty 0.25

## 2011-08-25 MED ORDER — LACTATED RINGERS IV SOLN
INTRAVENOUS | Status: DC
  Administered 2011-08-25 – 2011-08-26 (×5): via INTRAVENOUS
  Administered 2011-08-26: 300 mL via INTRAVENOUS
  Administered 2011-08-27: 05:00:00 via INTRAVENOUS

## 2011-08-25 NOTE — Progress Notes (Signed)
Tracy Choi is a 26 y.o. G1P0000 at [redacted]w[redacted]d by LMP and first trimester [place] admitted for induction of labor due to Post dates. Due date 08/18/11.  Pt received her prenatal care at [facility] and her pregnancy has been essentially unremarkable. See H&P by Dr Emelda Fear dated 6/10- phys exam unchanged. Pt continues to deny current HSV s/s.  Subjective: Feels well; denies leak or bldg.  Objective: BP 121/76  Pulse 91  Resp 18  Ht 5\' 5"  (1.651 m)  Wt 73.483 kg (162 lb)  BMI 26.96 kg/m2  Breastfeeding? Unknown      FHT:  FHR: 125 bpm, variability: moderate,  accelerations:  Present,  decelerations:  Absent UC:   irregular, every 8+ minutes SVE:   Dilation: 1.5 Exam by:: Clelia Croft, CNM 1.5/50/-2/vtx  Labs: Lab Results  Component Value Date   WBC 14.3* 08/25/2011   HGB 14.0 08/25/2011   HCT 40.1 08/25/2011   MCV 85.7 08/25/2011   PLT 211 08/25/2011    Assessment / Plan: IUP at term Unfavorable cx  Rev'd process of induction including methods and lengthiness. Will begin with cytotec during the night and proceed with either foley bulb or pitocin. Pt agreeable.  Cam Hai 08/25/2011, 10:57 PM

## 2011-08-26 ENCOUNTER — Encounter (HOSPITAL_COMMUNITY): Payer: Self-pay

## 2011-08-26 ENCOUNTER — Inpatient Hospital Stay (HOSPITAL_COMMUNITY): Payer: Medicaid Other | Admitting: Anesthesiology

## 2011-08-26 ENCOUNTER — Encounter (HOSPITAL_COMMUNITY): Payer: Self-pay | Admitting: Anesthesiology

## 2011-08-26 LAB — RPR: RPR Ser Ql: NONREACTIVE

## 2011-08-26 MED ORDER — PHENYLEPHRINE 40 MCG/ML (10ML) SYRINGE FOR IV PUSH (FOR BLOOD PRESSURE SUPPORT): 80.0000 ug | PREFILLED_SYRINGE | INTRAVENOUS | Status: DC | PRN

## 2011-08-26 MED ORDER — DIPHENHYDRAMINE HCL 50 MG/ML IJ SOLN
12.5000 mg | INTRAMUSCULAR | Status: DC | PRN
  Administered 2011-08-26 (×2): 12.5 mg via INTRAVENOUS
  Filled 2011-08-26: qty 1

## 2011-08-26 MED ORDER — FENTANYL 2.5 MCG/ML BUPIVACAINE 1/10 % EPIDURAL INFUSION (WH - ANES)
14.0000 mL/h | INTRAMUSCULAR | Status: DC
  Administered 2011-08-26 – 2011-08-27 (×3): 14 mL/h via EPIDURAL
  Filled 2011-08-26 (×4): qty 60

## 2011-08-26 MED ORDER — LACTATED RINGERS IV SOLN: 500.0000 mL | Freq: Once | INTRAVENOUS | Status: DC

## 2011-08-26 MED ORDER — FENTANYL 2.5 MCG/ML BUPIVACAINE 1/10 % EPIDURAL INFUSION (WH - ANES)
INTRAMUSCULAR | Status: DC | PRN
  Administered 2011-08-26: 13 mL/h via EPIDURAL

## 2011-08-26 MED ORDER — EPHEDRINE 5 MG/ML INJ: 10.0000 mg | INTRAVENOUS | Status: DC | PRN

## 2011-08-26 MED ORDER — PHENYLEPHRINE 40 MCG/ML (10ML) SYRINGE FOR IV PUSH (FOR BLOOD PRESSURE SUPPORT)
80.0000 ug | PREFILLED_SYRINGE | INTRAVENOUS | Status: DC | PRN
  Filled 2011-08-26: qty 5

## 2011-08-26 MED ORDER — SODIUM BICARBONATE 8.4 % IV SOLN
INTRAVENOUS | Status: DC | PRN
  Administered 2011-08-26: 4 mL via EPIDURAL

## 2011-08-26 MED ORDER — EPHEDRINE 5 MG/ML INJ
10.0000 mg | INTRAVENOUS | Status: DC | PRN
  Filled 2011-08-26: qty 4

## 2011-08-26 MED ORDER — TERBUTALINE SULFATE 1 MG/ML IJ SOLN: 0.2500 mg | Freq: Once | INTRAMUSCULAR | Status: AC | PRN

## 2011-08-26 MED ORDER — OXYTOCIN 20 UNITS IN LACTATED RINGERS INFUSION - SIMPLE
1.0000 m[IU]/min | INTRAVENOUS | Status: DC
  Administered 2011-08-26: 2 m[IU]/min via INTRAVENOUS
  Filled 2011-08-26: qty 1000

## 2011-08-26 NOTE — Anesthesia Procedure Notes (Signed)

## 2011-08-26 NOTE — Progress Notes (Signed)
Tracy Choi is a 26 y.o. G1P0000 at [redacted]w[redacted]d.  Subjective: Comfortable w epidural  Objective: BP 108/59  Pulse 70  Temp 98.3 F (36.8 C) (Oral)  Resp 18  Ht 5\' 5"  (1.651 m)  Wt 73.483 kg (162 lb)  BMI 26.96 kg/m2  SpO2 99%  Breastfeeding? Unknown      FHT:  FHR: 145 bpm, variability: moderate,  accelerations:  Present,  decelerations:  Absent UC:   irregular, every 3-5 minutes, mild-moderate SVE:   Dilation: 5.5 Effacement (%): 60 Station: -1 Exam by:: v. Tracy Choi, cnm  Labs: Lab Results  Component Value Date   WBC 14.3* 08/25/2011   HGB 14.0 08/25/2011   HCT 40.1 08/25/2011   MCV 85.7 08/25/2011   PLT 211 08/25/2011    Assessment / Plan: Induction of labor due to postterm,  Progressing slowly, but appropriately.  Labor: Early labor. Start pitocin. Preeclampsia:  NA Fetal Wellbeing:  Category I Pain Control:  Epidural I/D:  n/a Anticipated MOD:  NSVD  Tracy Choi 08/26/2011, 10:46 PM

## 2011-08-26 NOTE — Progress Notes (Signed)
Spoke with Ivonne Andrew, CNM about contractions being 3-49min apart. Questioned whether to start pitocin. CNM will come and evaluate patient & decide from there.

## 2011-08-26 NOTE — Progress Notes (Signed)
Tracy Choi is a 26 y.o. G1P0000 at [redacted]w[redacted]d by admitted for induction of labor due to Post dates. Due date 08/18/11.  Subjective: Pt comfortable, walking in room with portable EFM.    Objective: BP 114/77  Pulse 78  Temp 97.8 F (36.6 C) (Oral)  Resp 18  Ht 5\' 5"  (1.651 m)  Wt 73.483 kg (162 lb)  BMI 26.96 kg/m2  SpO2 98%  Breastfeeding? Unknown      FHT:  FHR: 135 bpm, variability: moderate,  accelerations:  Present,  decelerations:  Absent UC:   irregular, every 10 minutes SVE:   Dilation: 2 (see MAR) Effacement (%): 50 Station: -2 Exam by:: [place]  Labs: Lab Results  Component Value Date   WBC 14.3* 08/25/2011   HGB 14.0 08/25/2011   HCT 40.1 08/25/2011   MCV 85.7 08/25/2011   PLT 211 08/25/2011    Assessment / Plan: Induction of labor due to postterm Cytotec x3 Plan for Foley bulb in 4 hours  Discussed Foley bulb with pt, risks/benefits, method.  Pt and family given opportunity to ask questions.  Labor: Progressing normally Preeclampsia:  N/A Fetal Wellbeing:  Category I Pain Control:  Labor support without medications I/D:  n/a Anticipated MOD:  NSVD  Tracy Choi, Tracy Choi 08/26/2011, 10:42 AM

## 2011-08-26 NOTE — Progress Notes (Signed)
Spoke with L. Leftwich-Kirby, CNM about pt's contractions Q11min. Questioned what to do about order for pitocin. Will continue to watch contractions & may start pitocin once they have spaced out.

## 2011-08-26 NOTE — Anesthesia Preprocedure Evaluation (Signed)
Anesthesia Evaluation  Patient identified by MRN, date of birth, ID band Patient awake    Reviewed: Allergy & Precautions, H&P , Patient's Chart, lab work & pertinent test results  Airway Mallampati: II  TM Distance: >3 FB Neck ROM: full    Dental  (+) Teeth Intact   Pulmonary asthma ,  breath sounds clear to auscultation        Cardiovascular Rhythm:regular Rate:Normal     Neuro/Psych    GI/Hepatic GERD-  ,  Endo/Other    Renal/GU      Musculoskeletal   Abdominal   Peds  Hematology   Anesthesia Other Findings       Reproductive/Obstetrics (+) Pregnancy                             Anesthesia Physical Anesthesia Plan  ASA: II  Anesthesia Plan: Epidural   Post-op Pain Management:    Induction:   Airway Management Planned:   Additional Equipment:   Intra-op Plan:   Post-operative Plan:   Informed Consent: I have reviewed the patients History and Physical, chart, labs and discussed the procedure including the risks, benefits and alternatives for the proposed anesthesia with the patient or authorized representative who has indicated his/her understanding and acceptance.   Dental Advisory Given  Plan Discussed with:   Anesthesia Plan Comments: (Labs checked- platelets confirmed with RN in room. Fetal heart tracing, per RN, reported to be stable enough for sitting procedure. Discussed epidural, and patient consents to the procedure:  included risk of possible headache,backache, failed block, allergic reaction, and nerve injury. This patient was asked if she had any questions or concerns before the procedure started.)        Anesthesia Quick Evaluation  

## 2011-08-27 ENCOUNTER — Encounter (HOSPITAL_COMMUNITY): Payer: Self-pay

## 2011-08-27 DIAGNOSIS — O480 Post-term pregnancy: Secondary | ICD-10-CM

## 2011-08-27 MED ORDER — IBUPROFEN 600 MG PO TABS
600.0000 mg | ORAL_TABLET | Freq: Four times a day (QID) | ORAL | Status: DC
  Administered 2011-08-27 – 2011-08-28 (×5): 600 mg via ORAL
  Filled 2011-08-27 (×5): qty 1

## 2011-08-27 MED ORDER — ONDANSETRON HCL 4 MG/2ML IJ SOLN: 4.0000 mg | INTRAMUSCULAR | Status: DC | PRN

## 2011-08-27 MED ORDER — SENNOSIDES-DOCUSATE SODIUM 8.6-50 MG PO TABS
2.0000 | ORAL_TABLET | Freq: Every day | ORAL | Status: DC
  Administered 2011-08-27: 2 via ORAL

## 2011-08-27 MED ORDER — WITCH HAZEL-GLYCERIN EX PADS: 1.0000 "application " | MEDICATED_PAD | CUTANEOUS | Status: DC | PRN

## 2011-08-27 MED ORDER — ONDANSETRON HCL 4 MG PO TABS: 4.0000 mg | ORAL_TABLET | ORAL | Status: DC | PRN

## 2011-08-27 MED ORDER — PANTOPRAZOLE SODIUM 40 MG PO TBEC
40.0000 mg | DELAYED_RELEASE_TABLET | Freq: Every day | ORAL | Status: DC
  Administered 2011-08-27: 40 mg via ORAL
  Filled 2011-08-27 (×3): qty 1

## 2011-08-27 MED ORDER — TETANUS-DIPHTH-ACELL PERTUSSIS 5-2.5-18.5 LF-MCG/0.5 IM SUSP
0.5000 mL | Freq: Once | INTRAMUSCULAR | Status: AC
  Administered 2011-08-28: 0.5 mL via INTRAMUSCULAR
  Filled 2011-08-27: qty 0.5

## 2011-08-27 MED ORDER — BENZOCAINE-MENTHOL 20-0.5 % EX AERO
1.0000 "application " | INHALATION_SPRAY | CUTANEOUS | Status: DC | PRN
  Administered 2011-08-27: 1 via TOPICAL
  Filled 2011-08-27: qty 56

## 2011-08-27 MED ORDER — ZOLPIDEM TARTRATE 5 MG PO TABS: 5.0000 mg | ORAL_TABLET | Freq: Every evening | ORAL | Status: DC | PRN

## 2011-08-27 MED ORDER — PRENATAL MULTIVITAMIN CH
1.0000 | ORAL_TABLET | Freq: Every day | ORAL | Status: DC
  Administered 2011-08-28: 1 via ORAL
  Filled 2011-08-27: qty 1

## 2011-08-27 MED ORDER — LACTATED RINGERS IV SOLN
INTRAVENOUS | Status: DC
  Administered 2011-08-27: 06:00:00 via INTRAUTERINE

## 2011-08-27 MED ORDER — LANOLIN HYDROUS EX OINT: TOPICAL_OINTMENT | CUTANEOUS | Status: DC | PRN

## 2011-08-27 MED ORDER — DIPHENHYDRAMINE HCL 25 MG PO CAPS: 25.0000 mg | ORAL_CAPSULE | Freq: Four times a day (QID) | ORAL | Status: DC | PRN

## 2011-08-27 MED ORDER — DIBUCAINE 1 % RE OINT: 1.0000 "application " | TOPICAL_OINTMENT | RECTAL | Status: DC | PRN

## 2011-08-27 MED ORDER — SIMETHICONE 80 MG PO CHEW: 80.0000 mg | CHEWABLE_TABLET | ORAL | Status: DC | PRN

## 2011-08-27 MED ORDER — OXYCODONE-ACETAMINOPHEN 5-325 MG PO TABS
1.0000 | ORAL_TABLET | ORAL | Status: DC | PRN
  Administered 2011-08-27 – 2011-08-28 (×2): 1 via ORAL
  Filled 2011-08-27 (×2): qty 1

## 2011-08-27 NOTE — Clinical Social Work Psychosocial (Signed)
    Clinical Social Work Department BRIEF PSYCHOSOCIAL ASSESSMENT 08/27/2011  Patient:  Tracy Choi, Tracy Choi     Account Number:  [id number]     Admit date:  08/25/2011  Clinical Social Worker:  Andy Gauss  Date/Time:  08/27/2011 11:55 AM  Referred by:  Physician  Date Referred:  08/27/2011 Referred for  Behavioral Health Issues   Other Referral:   Hx of depression   Interview type:  Patient Other interview type:    PSYCHOSOCIAL DATA Living Status:  OTHER Admitted from facility:   Level of care:   Primary support name:  Jamison Neighbor Primary support relationship to patient:  FRIEND Degree of support available:   Involved    CURRENT CONCERNS Current Concerns  Behavioral Health Issues   Other Concerns:    SOCIAL WORK ASSESSMENT / PLAN Sw met with pt to assess history of depression.  Pt reports that she became depressed when her fiance was incarcerated. She denies domestic violence.  As a result, she was prescribed Xanax and Zoloft, of which she took until September 2012.  Pt states she was able to cope well without the medication during the pregnancy.  She denies any depression now, since FOB is no  longer incarcerated.  No history of SI/HI.  She identified her aunt, as her primary support person. She has all the necessary supplies for the infant.  Sw discussed PP depression symptoms with the pt and she agrees to seek medical attention if needed.  Pt was appropriate and appeared to be bonding well with the infant. Sw available to assist further if needed.   Assessment/plan status:  No Further Intervention Required Other assessment/ plan:   Information/referral to community resources:   PP depression literature    PATIENT'S/FAMILY'S RESPONSE TO PLAN OF CARE: Pt thanked Sw for consult.

## 2011-08-27 NOTE — Progress Notes (Signed)
Tracy Choi is a 26 y.o. G1P0000 at [redacted]w[redacted]d.  Subjective: Comfortable w/ epidural  Objective: BP 112/63  Pulse 63  Temp 98.2 F (36.8 C) (Oral)  Resp 18  Ht 5\' 5"  (1.651 m)  Wt 73.483 kg (162 lb)  BMI 26.96 kg/m2  SpO2 96%  Breastfeeding? Unknown      FHT:  FHR: 120 bpm, variability: moderate,  accelerations:  Present,  decelerations:  Present Prolonged decel x 6 minutes, good recovery then 29 minute period of FHR 90-120''s, resolved w/ position changes, bolus, O2 and pitocin D/C'd. UC:   regular, every 2-3 minutes SVE:   Dilation: 7 Effacement (%): 90 Station: 0 Exam by:: v. Shauntell Iglesia, cnm AROM w/ scant LOF. Placed IUPC, no return.   Labs: Lab Results  Component Value Date   WBC 14.3* 08/25/2011   HGB 14.0 08/25/2011   HCT 40.1 08/25/2011   MCV 85.7 08/25/2011   PLT 211 08/25/2011    Assessment / Plan: Induction of labor due to postterm,  progressing well on pitocin  Labor: Progressing normally Preeclampsia:  NA Fetal Wellbeing:  Category II Pain Control:  Epidural I/D:  n/a Anticipated MOD:  NSVD Dr. Despina Hidden notified of decels. Recommends running amnioinfusion.  Dorathy Kinsman 08/27/2011, 5:55 AM

## 2011-08-27 NOTE — Progress Notes (Signed)
Patient and transferred to [facility] by wheelchair by RN. Report given to Manuela Neptune RN. Patient had no complaints at time of discharge.

## 2011-08-27 NOTE — Progress Notes (Signed)
Tracy Choi is a 26 y.o. G1P0000 at [redacted]w[redacted]d.  Subjective: Comfortable w/ epidural.  Objective: BP 99/49  Pulse 57  Temp 98.2 F (36.8 C) (Oral)  Resp 18  Ht 5\' 5"  (1.651 m)  Wt 73.483 kg (162 lb)  BMI 26.96 kg/m2  SpO2 99%  Breastfeeding? Unknown      FHT:  FHR: 130 bpm, variability: moderate,  accelerations:  Present,  decelerations:  Absent UC:   irregular, every 2-5 minutes SVE:   Dilation: 5.5 Effacement (%): 80 Station: -1 Exam by:: l.poore, rn  Labs: Lab Results  Component Value Date   WBC 14.3* 08/25/2011   HGB 14.0 08/25/2011   HCT 40.1 08/25/2011   MCV 85.7 08/25/2011   PLT 211 08/25/2011    Assessment / Plan: Protracted latent phase  Labor: Progressing on Pitocin, will continue to increase then AROM Preeclampsia:  NA Fetal Wellbeing:  Category I Pain Control:  Epidural I/D:  n/a Anticipated MOD:  NSVD  Cebastian Neis 08/27/2011, 3:52 AM

## 2011-08-28 MED ORDER — IBUPROFEN 600 MG PO TABS: 600.0000 mg | ORAL_TABLET | Freq: Four times a day (QID) | ORAL | Status: DC

## 2011-08-28 NOTE — Discharge Summary (Signed)
I saw patient following Dr. Berline Chough and I agree with his note Tracy Choi 08/28/2011 7:53 AM

## 2011-08-28 NOTE — Progress Notes (Signed)
UR chart review completed.  

## 2011-08-28 NOTE — Discharge Instructions (Signed)
Vaginal Delivery Care After  Change your pad on each trip to the bathroom.   Wipe gently with toilet paper during your hospital stay. Always wipe from front to back. A spray bottle with warm tap water could also be used or a towelette if available.   Place your soiled pad and toilet paper in a bathroom wastebasket with a plastic bag liner.   During your hospital stay, save any clots. If you pass a clot while on the toilet, do not flush it. Also, if your vaginal flow seems excessive to you, notify nursing personnel.   The first time you get out of bed after delivery, wait for assistance from a nurse. Do not get up alone at any time if you feel weak or dizzy.   Bend and extend your ankles forcefully so that you feel the calves of your legs get hard. Do this 6 times every hour when you are in bed and awake.   Do not sit with one foot under you, dangle your legs over the edge of the bed, or maintain a position that hinders the circulation in your legs.   Many women experience after pains for 2 to 3 days after delivery. These after pains are mild uterine contractions. Ask the nurse for a pain medication if you need something for this. Sometimes breastfeeding stimulates after pains; if you find this to be true, ask for the medication  -  hour before the next feeding.   For you and your infant's protection, do not go beyond the door(s) of the obstetric unit. Do not carry your baby in your arms in the hallway. When taking your baby to and from your room, put your baby in the bassinet and push the bassinet.   Mothers may have their babies in their room as much as they desire.  Document Released: 02/28/2000 Document Revised: 02/19/2011 Document Reviewed: 01/28/2007 Exit[employer] Patient Information 2012 [employer]. 

## 2011-08-28 NOTE — Discharge Summary (Signed)
Obstetric Discharge Summary Reason for Admission: induction of labor Prenatal Procedures: none Intrapartum Procedures: spontaneous vaginal delivery Postpartum Procedures: none Complications-Operative and Postpartum: Diffuse Skin tears repaired with 3.0 viral for point bleeding Hemoglobin  Date Value Range Status  08/25/2011 14.0  12.0 - 15.0 g/dL Final     HCT  Date Value Range Status  08/25/2011 40.1  36.0 - 46.0 % Final    Physical Exam:  General: alert, cooperative and appears stated age Lochia: appropriate Uterine Fundus: firm DVT Evaluation: No evidence of DVT seen on physical exam. Negative Homan's sign. No cords or calf tenderness. No significant calf/ankle edema.  Discharge Diagnoses: Term Pregnancy-delivered  Discharge Information: Date: 08/28/2011 Activity: pelvic rest Diet: routine Medications: Ibuprofen Condition: stable Instructions: refer to practice specific booklet Discharge to: home   Newborn Data: Live born female  Birth Weight: 7 lb 5.8 oz (3340 g) APGAR: 9, 9  Home with mother. Unsure of birth control Breast feeding   Tracy Choi 08/28/2011, 7:43 AM

## 2011-08-28 NOTE — Anesthesia Postprocedure Evaluation (Signed)
  Anesthesia Post-op Note  Patient: Tracy Choi  Procedure(s) Performed: * No procedures listed *  Patient Location: Mother/Baby  Anesthesia Type: Epidural  Level of Consciousness: awake, alert  and oriented  Airway and Oxygen Therapy: Patient Spontanous Breathing  Post-op Pain: none  Post-op Assessment: Post-op Vital signs reviewed and Patient's Cardiovascular Status Stable  Post-op Vital Signs: Reviewed and stable  Complications: No apparent anesthesia complications

## 2011-09-05 ENCOUNTER — Encounter (HOSPITAL_COMMUNITY): Payer: Self-pay | Admitting: *Deleted

## 2011-09-05 ENCOUNTER — Inpatient Hospital Stay (HOSPITAL_COMMUNITY)
Admission: AD | Admit: 2011-09-05 | Discharge: 2011-09-05 | Disposition: A | Discharge status: Home / Self Care | Payer: Medicaid Other | Source: Ambulatory Visit | Attending: Family Medicine | Admitting: Family Medicine

## 2011-09-05 DIAGNOSIS — O26899 Other specified pregnancy related conditions, unspecified trimester: Secondary | ICD-10-CM

## 2011-09-05 DIAGNOSIS — R109 Unspecified abdominal pain: Secondary | ICD-10-CM | POA: Insufficient documentation

## 2011-09-05 DIAGNOSIS — O9089 Other complications of the puerperium, not elsewhere classified: Secondary | ICD-10-CM

## 2011-09-05 DIAGNOSIS — O99893 Other specified diseases and conditions complicating puerperium: Secondary | ICD-10-CM | POA: Insufficient documentation

## 2011-09-05 DIAGNOSIS — R252 Cramp and spasm: Secondary | ICD-10-CM

## 2011-09-05 HISTORY — DX: Herpesviral infection, unspecified: B00.9

## 2011-09-05 MED ORDER — IBUPROFEN 600 MG PO TABS: 600.0000 mg | ORAL_TABLET | Freq: Four times a day (QID) | ORAL | Status: AC | PRN

## 2011-09-05 NOTE — MAU Note (Signed)
Had baby on 08/27/11, breast feeding/ pumping and using formula.  Started having cramping that felt like contractions after cleaning up the house.  Took 600 mg Motrin and pain decreased.

## 2011-09-05 NOTE — MAU Note (Signed)
Had vaginal birth on 08/27/11 having cramping that felt like contractions took Ibuprofen 600 mg it eased off, bleeding changing a pad every hour

## 2011-09-05 NOTE — MAU Provider Note (Signed)
Chart reviewed and agree with management and plan.  

## 2011-09-05 NOTE — Discharge Instructions (Signed)

## 2011-09-05 NOTE — MAU Provider Note (Signed)
History     CSN: [id number]  Arrival date and time: 09/05/11 1237   None     Chief Complaint  Patient presents with  . Abdominal Cramping   HPI This is a 26 y.o. female who is s/p SVD on June 13 , who presents with c/o one episode of uterine cramping earlier today. It resolved 30 min after taking ibuprofen. No increase in bleeding. No fever or abdominal pain. Was afraid she had "an infection or my gall bladder was going to burst".  Cleaned house and washed the car.    RN Note: Had baby on 08/27/11, breast feeding/ pumping and using formula. Started having cramping that felt like contractions after cleaning up the house. Took 600 mg Motrin and pain decreased  OB History    Grav Para Term Preterm Abortions TAB SAB Ect Mult Living   1 1 1  0 0 0 0 0 0 1      Past Medical History  Diagnosis Date  . Bronchitis   . Herpes     do not let any family members know of the HSV  . Depression   . Asthma   . GERD (gastroesophageal reflux disease)   . Abnormal Pap smear   . Herpes simplex     treated with Acycolvir during pregnancy    Past Surgical History  Procedure Date  . Wisdom tooth extraction     Family History  Problem Relation Age of Onset  . Anesthesia problems Neg Hx   . Cancer Mother   . Alcohol abuse Mother   . Hypertension Mother   . Hypertension Maternal Aunt   . Diabetes Maternal Uncle   . Heart disease Maternal Uncle   . Hypertension Maternal Uncle   . Hypertension Paternal Aunt   . Hypertension Paternal Uncle   . Heart disease Maternal Grandmother   . Hypertension Maternal Grandmother   . Stroke Maternal Grandmother   . Heart disease Maternal Grandfather   . Hypertension Maternal Grandfather   . Stroke Maternal Grandfather   . Cancer Paternal Grandmother   . Hypertension Paternal Grandmother   . Hypertension Paternal Grandfather     History  Substance Use Topics  . Smoking status: Current Some Day Smoker -- 0.5 packs/day for 10 years    Types:  Cigarettes  . Smokeless tobacco: Former Neurosurgeon  . Alcohol Use: No    Allergies:  Allergies  Allergen Reactions  . Peanut-Containing Drug Products Anaphylaxis  . Vicodin (Hydrocodone-Acetaminophen) Nausea And Vomiting    Patient states that she gets sick to her stomach when medicine is "getting out of her system"    Prescriptions prior to admission  Medication Sig Dispense Refill  . acetaminophen (TYLENOL) 325 MG tablet Take 650 mg by mouth daily as needed. For pain      . albuterol (VENTOLIN HFA) 108 (90 BASE) MCG/ACT inhaler Inhale 2 puffs into the lungs every 6 (six) hours as needed. For asthmatic bronchitis      . omeprazole (PRILOSEC) 20 MG capsule Take 20 mg by mouth at bedtime.      . Prenatal Vit-Fe Fumarate-FA (PRENATAL MULTIVITAMIN) TABS Take 1 tablet by mouth every morning.        ROS As listed in HPI  Physical Exam   Blood pressure 124/89, pulse 91, temperature 98.9 F (37.2 C), temperature source Oral, resp. rate 16, currently breastfeeding.  Physical Exam  Constitutional: She is oriented to person, place, and time. She appears well-developed and well-nourished. No distress.  HENT:  Head: Normocephalic.  Cardiovascular: Normal rate.   Respiratory: Effort normal.  GI: Soft. She exhibits no distension and no mass. There is tenderness (slight over uterus). There is no rebound and no guarding.  Genitourinary: Uterus normal. Vaginal discharge (small bleeding) found.       Uterus well involuted, slightly tender, no sign of endometritis   Musculoskeletal: Normal range of motion.  Neurological: She is alert and oriented to person, place, and time.  Skin: Skin is warm and dry.  Psychiatric: She has a normal mood and affect.    MAU Course  Procedures   Assessment and Plan  A:  Postpartum      Uterine cramping  P:  Discharge home       Discussed normal lochia and cramping associated with postpartum changes. Reassured       Return if fever, increased pain or heavy  bleeding       Refill ibuprofen  Grandt,Slayden Mennenga 09/05/2011, 1:27 PM

## 2013-01-04 ENCOUNTER — Telehealth: Payer: Self-pay | Admitting: *Deleted

## 2013-01-04 MED ORDER — NORGESTIM-ETH ESTRAD TRIPHASIC 0.18/0.215/0.25 MG-25 MCG PO TABS: 1.0000 | ORAL_TABLET | Freq: Every day | ORAL | Status: DC

## 2013-01-04 NOTE — Telephone Encounter (Signed)
Pt requesting refill for Orthotricyclene Lo to [facility] in [place]. Pt has an appt 02/28/2013 with Genelle Gather.

## 2013-01-04 NOTE — Telephone Encounter (Signed)
Refilled OCs 

## 2013-02-14 ENCOUNTER — Ambulatory Visit (HOSPITAL_COMMUNITY)
Admission: RE | Admit: 2013-02-14 | Discharge: 2013-02-14 | Disposition: A | Discharge status: Home / Self Care | Payer: Medicaid Other | Source: Ambulatory Visit | Attending: Family Medicine | Admitting: Family Medicine

## 2013-02-14 DIAGNOSIS — M549 Dorsalgia, unspecified: Secondary | ICD-10-CM

## 2013-02-14 DIAGNOSIS — M545 Low back pain, unspecified: Secondary | ICD-10-CM | POA: Insufficient documentation

## 2013-02-14 DIAGNOSIS — IMO0001 Reserved for inherently not codable concepts without codable children: Secondary | ICD-10-CM | POA: Insufficient documentation

## 2013-02-14 HISTORY — DX: Dorsalgia, unspecified: M54.9

## 2013-02-14 NOTE — Evaluation (Signed)
Physical Therapy Evaluation  Patient Details  Name: Tracy Choi MRN: [id number] Date of Birth: [birth year]  Today's Date: 02/14/2013 Time: 1025-1100 PT Time Calculation (min): 35 min              Visit#: 1 of 1   Charge:  evaluation Authorization: medicaid     Past Medical History:  Past Medical History  Diagnosis Date  . Bronchitis   . Herpes     do not let any family members know of the HSV  . Depression   . Asthma   . GERD (gastroesophageal reflux disease)   . Abnormal Pap smear   . Herpes simplex     treated with Acycolvir during pregnancy   Past Surgical History:  Past Surgical History  Procedure Laterality Date  . Wisdom tooth extraction      Subjective Symptoms/Limitations Symptoms: Pt states that she has had back pain for the past three months.  She states the pain is always there but some days are worse than others.  She states that the pain is in the dead center and radiates into both hips equally but does not go into her leg.  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, nature How long can you walk comfortably?: able to walk for 30 minutes  Pain Assessment Currently in Pain?: Yes Pain Score: 3  (gets as high as a 10) Pain Location: Back Pain Orientation: Lower     Sensation/Coordination/Flexibility/Functional Tests Flexibility Thomas: Negative 90/90: Positive  Assessment RLE Strength RLE Overall Strength: Within Functional Limits for tasks assessed LLE Assessment LLE Assessment: Within Functional Limits Lumbar AROM Lumbar Flexion: wfl reps   Lumbar Extension: decreased 50% reps increase pain Lumbar - Right Side Bend: wnl Lumbar - Left Side Bend: wnl Lumbar - Right Rotation: wnl Lumbar - Left Rotation: wnl  Exercise/Treatments      Stretches Active Hamstring Stretch: 3 reps;30 seconds Single Knee to Chest Stretch: 3 reps;30 seconds Lower Trunk Rotation: 5 reps Seated Other Seated Lumbar Exercises: scapular retraction x 10 Other Seated Lumbar Exercises: Kegal x 10 Supine Ab Set: 10 reps Straight Leg Raise: 5 reps    Physical Therapy Assessment and Plan PT Assessment and Plan Clinical Impression Statement: Pt has hx of low back pain for several months.  Pt exhibits tight hamstrings B as well as decreased abdominal strength.  Pt will be given HEP to address these issures. Pt will benefit from skilled therapeutic intervention in order to improve on the following deficits: Pain;Decreased range of motion Rehab Potential: Good PT Frequency:  (one time visit only per insurance) PT Plan: D/c    Goals Home Exercise Program Pt/caregiver will Perform Home Exercise Program: For increased strengthening;For increased ROM  Problem List Patient Active Problem List   Diagnosis Date Noted  . Back pain 02/14/2013  . Vaginal bleeding in pregnancy, third trimester 07/29/2011    PT Plan of Care PT Home Exercise Plan: given  GP Functional Assessment Tool Used: clinical judgement Functional Limitation: Mobility: Walking and moving around Mobility: Walking and Moving Around Current Status (W0981): At least 20 percent but less than 40 percent impaired, limited or restricted Mobility: Walking and Moving Around Goal Status [phone]): At least 20 percent but less than 40 percent impaired, limited or restricted Mobility: Walking and  Moving Around Discharge Status [phone]): At least 20 percent but less than 40 percent impaired, limited or restricted  Takoda Janowiak,CINDY 02/14/2013, 4:32 PM  Physician Documentation Your signature is required to indicate approval of the treatment plan as stated above.  Please sign and either send electronically or make a copy of this report for your files and return this physician signed original.    Please mark one 1.__approve of plan  2. ___approve of plan with the following conditions.   ______________________________                                                          _____________________ Physician Signature                                                                                                             Date

## 2013-02-28 ENCOUNTER — Encounter (INDEPENDENT_AMBULATORY_CARE_PROVIDER_SITE_OTHER): Payer: Self-pay

## 2013-02-28 ENCOUNTER — Ambulatory Visit (INDEPENDENT_AMBULATORY_CARE_PROVIDER_SITE_OTHER): Payer: Medicaid Other | Admitting: Adult Health

## 2013-02-28 ENCOUNTER — Other Ambulatory Visit (HOSPITAL_COMMUNITY)
Admission: RE | Admit: 2013-02-28 | Discharge: 2013-02-28 | Disposition: A | Discharge status: Home / Self Care | Payer: Medicaid Other | Source: Ambulatory Visit | Attending: Adult Health | Admitting: Adult Health

## 2013-02-28 ENCOUNTER — Encounter: Payer: Self-pay | Admitting: Adult Health

## 2013-02-28 VITALS — BP 120/76 | HR 74 | Ht 64.0 in | Wt 157.0 lb

## 2013-02-28 DIAGNOSIS — Z01419 Encounter for gynecological examination (general) (routine) without abnormal findings: Secondary | ICD-10-CM

## 2013-02-28 DIAGNOSIS — N920 Excessive and frequent menstruation with regular cycle: Secondary | ICD-10-CM

## 2013-02-28 DIAGNOSIS — IMO0001 Reserved for inherently not codable concepts without codable children: Secondary | ICD-10-CM

## 2013-02-28 DIAGNOSIS — Z309 Encounter for contraceptive management, unspecified: Secondary | ICD-10-CM

## 2013-02-28 DIAGNOSIS — Z113 Encounter for screening for infections with a predominantly sexual mode of transmission: Secondary | ICD-10-CM | POA: Insufficient documentation

## 2013-02-28 DIAGNOSIS — Z0000 Encounter for general adult medical examination without abnormal findings: Secondary | ICD-10-CM

## 2013-02-28 HISTORY — DX: Excessive and frequent menstruation with regular cycle: N92.0

## 2013-02-28 HISTORY — DX: Reserved for inherently not codable concepts without codable children: IMO0001

## 2013-02-28 LAB — LIPID PANEL
HDL: 53 mg/dL (ref >39)
LDL Cholesterol: 118 mg/dL — ABNORMAL HIGH (ref 0–99)
Triglycerides: 123 mg/dL (ref <150)
VLDL: 25 mg/dL (ref 0–40)

## 2013-02-28 LAB — COMPREHENSIVE METABOLIC PANEL
AST: 13 U/L (ref 0–37)
Albumin: 4.1 g/dL (ref 3.5–5.2)
Alkaline Phosphatase: 62 U/L (ref 39–117)
BUN: 8 mg/dL (ref 6–23)
Potassium: 4.4 mEq/L (ref 3.5–5.3)

## 2013-02-28 LAB — CBC
HCT: 44.5 % (ref 36.0–46.0)
MCHC: 32.8 g/dL (ref 30.0–36.0)
Platelets: 324 10*3/uL (ref 150–400)
RDW: 13.5 % (ref 11.5–15.5)

## 2013-02-28 MED ORDER — NORGESTIMATE-ETH ESTRADIOL 0.25-35 MG-MCG PO TABS: 1.0000 | ORAL_TABLET | Freq: Every day | ORAL | Status: DC

## 2013-02-28 NOTE — Patient Instructions (Signed)
Physical in 1 year Start new pills use condoms x 1 month Follow up in 3 months Follow up labs in 24-48 hours Try 2 prilosec Gastroesophageal Reflux Disease, Adult Gastroesophageal reflux disease (GERD) happens when acid from your stomach flows up into the esophagus. When acid comes in contact with the esophagus, the acid causes soreness (inflammation) in the esophagus. Over time, GERD may create small holes (ulcers) in the lining of the esophagus. CAUSES   Increased body weight. This puts pressure on the stomach, making acid rise from the stomach into the esophagus.  Smoking. This increases acid production in the stomach.  Drinking alcohol. This causes decreased pressure in the lower esophageal sphincter (valve or ring of muscle between the esophagus and stomach), allowing acid from the stomach into the esophagus.  Late evening meals and a full stomach. This increases pressure and acid production in the stomach.  A malformed lower esophageal sphincter. Sometimes, no cause is found. SYMPTOMS   Burning pain in the lower part of the mid-chest behind the breastbone and in the mid-stomach area. This may occur twice a week or more often.  Trouble swallowing.  Sore throat.  Dry cough.  Asthma-like symptoms including chest tightness, shortness of breath, or wheezing. DIAGNOSIS  Your caregiver may be able to diagnose GERD based on your symptoms. In some cases, X-rays and other tests may be done to check for complications or to check the condition of your stomach and esophagus. TREATMENT  Your caregiver may recommend over-the-counter or prescription medicines to help decrease acid production. Ask your caregiver before starting or adding any new medicines.  HOME CARE INSTRUCTIONS   Change the factors that you can control. Ask your caregiver for guidance concerning weight loss, quitting smoking, and alcohol consumption.  Avoid foods and drinks that make your symptoms worse, such  as:  Caffeine or alcoholic drinks.  Chocolate.  Peppermint or mint flavorings.  Garlic and onions.  Spicy foods.  Citrus fruits, such as oranges, lemons, or limes.  Tomato-based foods such as sauce, chili, salsa, and pizza.  Fried and fatty foods.  Avoid lying down for the 3 hours prior to your bedtime or prior to taking a nap.  Eat small, frequent meals instead of large meals.  Wear loose-fitting clothing. Do not wear anything tight around your waist that causes pressure on your stomach.  Raise the head of your bed 6 to 8 inches with wood blocks to help you sleep. Extra pillows will not help.  Only take over-the-counter or prescription medicines for pain, discomfort, or fever as directed by your caregiver.  Do not take aspirin, ibuprofen, or other nonsteroidal anti-inflammatory drugs (NSAIDs). SEEK IMMEDIATE MEDICAL CARE IF:   You have pain in your arms, neck, jaw, teeth, or back.  Your pain increases or changes in intensity or duration.  You develop nausea, vomiting, or sweating (diaphoresis).  You develop shortness of breath, or you faint.  Your vomit is green, yellow, black, or looks like coffee grounds or blood.  Your stool is red, bloody, or black. These symptoms could be signs of other problems, such as heart disease, gastric bleeding, or esophageal bleeding. MAKE SURE YOU:   Understand these instructions.  Will watch your condition.  Will get help right away if you are not doing well or get worse. Document Released: 12/10/2004 Document Revised: 05/25/2011 Document Reviewed: 09/19/2010 [facility] Patient Information 2014 [place], [place].

## 2013-02-28 NOTE — Progress Notes (Signed)
Patient ID: Tracy Choi, female   DOB: 04/21/1985, 27 y.o.   MRN: [id number] History of Present Illness: Tracy Choi is a 27 year old white female in for a pap and physical.She is complaining of menstrual headaches,increased period flow and cramps.Reflux worse lately.   Current Medications, Allergies, Past Medical History, Past Surgical History, Family History and Social History were reviewed in [employer] record.     Review of Systems: Patient denies any blurred vision, shortness of breath, chest pain, abdominal pain, problems with bowel movements, urination, or intercourse. No joint pain or mood changes.Positives in HPI.    Physical Exam:BP 120/76  Pulse 74  Ht 5\' 4"  (1.626 m)  Wt 157 lb (71.215 kg)  BMI 26.94 kg/m2  LMP 02/24/2013  Breastfeeding? No General:  Well developed, well nourished, no acute distress Skin:  Warm and dry Neck:  Midline trachea, normal thyroid Lungs; Clear to auscultation bilaterally Breast:  No dominant palpable mass, retraction, or nipple discharge Cardiovascular: Regular rate and rhythm Abdomen:  Soft, non tender, no hepatosplenomegaly Pelvic:  External genitalia is normal in appearance.  The vagina is normal in appearance, with period like blood. The cervix is bulbous.Pap with GC/CHL performed.  Uterus is felt to be normal size, shape, and contour.  No adnexal masses or tenderness noted. Extremities:  No swelling or varicosities noted Psych:  No mood changes, alert and cooperative,seems happy   Impression: Yearly gyn exam Contraceptive management Menorrhagia, cramps and headaches with period Reflux    Plan: Will change OCs to sprintec, 1 daily refill x 1 year Use condoms Check CBC,CMP,TSH and lipids Increase prilosec to 2 daily to = 40 mg to see if helps Physical in 1 year Follow up in 3 months to see if pill change works Review handout on reflux

## 2013-03-01 ENCOUNTER — Telehealth: Payer: Self-pay | Admitting: Adult Health

## 2013-03-01 NOTE — Telephone Encounter (Signed)
Pt aware of labs  

## 2013-03-28 ENCOUNTER — Other Ambulatory Visit: Payer: Self-pay | Admitting: Adult Health

## 2013-03-28 MED ORDER — NORGESTIMATE-ETH ESTRADIOL 0.25-35 MG-MCG PO TABS: 1.0000 | ORAL_TABLET | Freq: Every day | ORAL | Status: DC

## 2013-05-29 ENCOUNTER — Ambulatory Visit: Payer: Medicaid Other | Admitting: Adult Health

## 2013-11-15 ENCOUNTER — Other Ambulatory Visit (HOSPITAL_COMMUNITY): Payer: Self-pay | Admitting: Family Medicine

## 2013-11-15 ENCOUNTER — Ambulatory Visit (HOSPITAL_COMMUNITY)
Admission: RE | Admit: 2013-11-15 | Discharge: 2013-11-15 | Disposition: A | Discharge status: Home / Self Care | Payer: Medicaid Other | Source: Ambulatory Visit | Attending: Family Medicine | Admitting: Family Medicine

## 2013-11-15 DIAGNOSIS — F17200 Nicotine dependence, unspecified, uncomplicated: Secondary | ICD-10-CM

## 2013-11-15 DIAGNOSIS — Z0000 Encounter for general adult medical examination without abnormal findings: Secondary | ICD-10-CM

## 2013-11-15 DIAGNOSIS — R0602 Shortness of breath: Secondary | ICD-10-CM | POA: Insufficient documentation

## 2014-01-15 ENCOUNTER — Encounter: Payer: Self-pay | Admitting: Adult Health

## 2014-03-01 ENCOUNTER — Encounter: Payer: Self-pay | Admitting: Adult Health

## 2014-03-01 ENCOUNTER — Ambulatory Visit (INDEPENDENT_AMBULATORY_CARE_PROVIDER_SITE_OTHER): Payer: Medicaid Other | Admitting: Adult Health

## 2014-03-01 VITALS — BP 136/72 | HR 78 | Ht 65.25 in | Wt 149.0 lb

## 2014-03-01 DIAGNOSIS — Z3041 Encounter for surveillance of contraceptive pills: Secondary | ICD-10-CM

## 2014-03-01 DIAGNOSIS — Z01419 Encounter for gynecological examination (general) (routine) without abnormal findings: Secondary | ICD-10-CM

## 2014-03-01 DIAGNOSIS — Z0000 Encounter for general adult medical examination without abnormal findings: Secondary | ICD-10-CM

## 2014-03-01 MED ORDER — NORGESTIMATE-ETH ESTRADIOL 0.25-35 MG-MCG PO TABS: 1.0000 | ORAL_TABLET | Freq: Every day | ORAL | Status: DC

## 2014-03-01 NOTE — Progress Notes (Signed)
Patient ID: Tracy Choi, female   DOB: 11/04/1985, 28 y.o.   MRN: [id number] History of Present Illness:  Tracy Choi is a 28 year old white female in for a gyn exam.She had a normal pap 02/28/13.She is happy with her OCs and needs a refill.  Current Medications, Allergies, Past Medical History, Past Surgical History, Family History and Social History were [facility].     Review of Systems: Patient denies any headaches, blurred vision, shortness of breath, chest pain, abdominal pain, problems with bowel movements, urination, or intercourse. No joint pain or mood swings.    Physical Exam:BP 136/72 mmHg  Pulse 78  Ht 5' 5.25" (1.657 m)  Wt 149 lb (67.586 kg)  BMI 24.62 kg/m2  LMP 02/07/2014 General:  Well developed, well nourished, no acute distress Skin:  Warm and dry Neck:  Midline trachea, normal thyroid Lungs; Clear to auscultation bilaterally Breast:  No dominant palpable mass, retraction, or nipple discharge, has bilateral irregularities  Cardiovascular: Regular rate and rhythm Abdomen:  Soft, non tender, no hepatosplenomegaly Pelvic:  External genitalia is normal in appearance.  The vagina is normal in appearance.  The cervix is bulbous.  Uterus is felt to be normal size, shape, and contour.  No  adnexal masses or tenderness noted. Extremities:  No swelling or varicosities noted Psych:  No mood changes,alert and cooperative,seems happy   Impression: Well woman gyn exam no pap Contraceptive management    Plan: Pap and physical in 1 year Refilled sprintec x 1 year Declines flu shot

## 2014-03-01 NOTE — Patient Instructions (Signed)
Pap and physical in 1 year Continue OCs  

## 2014-06-13 ENCOUNTER — Other Ambulatory Visit: Payer: Self-pay | Admitting: Adult Health

## 2014-06-15 ENCOUNTER — Encounter (HOSPITAL_COMMUNITY): Payer: Self-pay

## 2014-06-15 ENCOUNTER — Emergency Department (HOSPITAL_COMMUNITY): Payer: Medicaid Other

## 2014-06-15 ENCOUNTER — Other Ambulatory Visit: Payer: Self-pay

## 2014-06-15 ENCOUNTER — Emergency Department (HOSPITAL_COMMUNITY)
Admission: EM | Admit: 2014-06-15 | Discharge: 2014-06-15 | Disposition: A | Discharge status: Home / Self Care | Payer: Medicaid Other | Attending: Emergency Medicine | Admitting: Emergency Medicine

## 2014-06-15 DIAGNOSIS — J069 Acute upper respiratory infection, unspecified: Secondary | ICD-10-CM

## 2014-06-15 DIAGNOSIS — R05 Cough: Secondary | ICD-10-CM | POA: Diagnosis present

## 2014-06-15 DIAGNOSIS — Z8619 Personal history of other infectious and parasitic diseases: Secondary | ICD-10-CM | POA: Insufficient documentation

## 2014-06-15 DIAGNOSIS — J45909 Unspecified asthma, uncomplicated: Secondary | ICD-10-CM | POA: Insufficient documentation

## 2014-06-15 DIAGNOSIS — Z3202 Encounter for pregnancy test, result negative: Secondary | ICD-10-CM | POA: Diagnosis not present

## 2014-06-15 DIAGNOSIS — K219 Gastro-esophageal reflux disease without esophagitis: Secondary | ICD-10-CM | POA: Insufficient documentation

## 2014-06-15 DIAGNOSIS — F329 Major depressive disorder, single episode, unspecified: Secondary | ICD-10-CM | POA: Diagnosis not present

## 2014-06-15 DIAGNOSIS — F419 Anxiety disorder, unspecified: Secondary | ICD-10-CM | POA: Insufficient documentation

## 2014-06-15 DIAGNOSIS — Z720 Tobacco use: Secondary | ICD-10-CM | POA: Diagnosis not present

## 2014-06-15 DIAGNOSIS — Z793 Long term (current) use of hormonal contraceptives: Secondary | ICD-10-CM | POA: Diagnosis not present

## 2014-06-15 DIAGNOSIS — Z79899 Other long term (current) drug therapy: Secondary | ICD-10-CM | POA: Insufficient documentation

## 2014-06-15 LAB — URINALYSIS, ROUTINE W REFLEX MICROSCOPIC
BILIRUBIN URINE: NEGATIVE
Glucose, UA: NEGATIVE mg/dL
HGB URINE DIPSTICK: NEGATIVE
KETONES UR: 15 mg/dL — AB
Leukocytes, UA: NEGATIVE
Nitrite: NEGATIVE
Protein, ur: NEGATIVE mg/dL
SPECIFIC GRAVITY, URINE: 1.025 (ref 1.005–1.030)
UROBILINOGEN UA: 0.2 mg/dL (ref 0.0–1.0)
pH: 6 (ref 5.0–8.0)

## 2014-06-15 LAB — POC URINE PREG, ED: Preg Test, Ur: NEGATIVE

## 2014-06-15 MED ORDER — LORATADINE-PSEUDOEPHEDRINE ER 5-120 MG PO TB12: 1.0000 | ORAL_TABLET | Freq: Two times a day (BID) | ORAL | Status: DC

## 2014-06-15 MED ORDER — ONDANSETRON HCL 4 MG PO TABS
4.0000 mg | ORAL_TABLET | Freq: Once | ORAL | Status: AC
  Administered 2014-06-15: 4 mg via ORAL
  Filled 2014-06-15: qty 1

## 2014-06-15 MED ORDER — HYDROCOD POLST-CHLORPHEN POLST 10-8 MG/5ML PO LQCR
5.0000 mL | Freq: Once | ORAL | Status: AC
  Administered 2014-06-15: 5 mL via ORAL
  Filled 2014-06-15: qty 5

## 2014-06-15 MED ORDER — HYDROCODONE-HOMATROPINE 5-1.5 MG/5ML PO SYRP: 5.0000 mL | ORAL_SOLUTION | Freq: Four times a day (QID) | ORAL | Status: DC | PRN

## 2014-06-15 NOTE — ED Notes (Signed)
Patient with no complaints at this time. Respirations even and unlabored. Skin warm/dry. Discharge instructions reviewed with patient at this time. Patient given opportunity to voice concerns/ask questions. Patient discharged at this time and left Emergency Department with steady gait.   

## 2014-06-15 NOTE — ED Provider Notes (Signed)
CSN: [id number]     Arrival date & time 06/15/14  1912 History   First MD Initiated Contact with Patient 06/15/14 2047     Chief Complaint  Patient presents with  . URI     (Consider location/radiation/quality/duration/timing/severity/associated sxs/prior Treatment) HPI Comments: Patient is a 30 year old female who presents to the emergency department with cough, hyperventilation, drawing and spasm of both hands.  Patient states that over the past week she's been having upper respiratory symptoms, including congestion, cough, and body aches. She does not recall any high fever. She denies earache, has had some scratchy throat, and nasal congestion. She states that her cough has been getting deeper and deeper. She also complains that her cough has been taking longer to get over. Yesterday she felt as though she could not breathe adequately, and used her inhaler 4 times. Today she had more cough, the same deeper, and she reached a point where she felt as though she could not breathe. This was followed by a sensation of something heavy on her chest, and drawing of the extremities particularly the hands. The patient also complains of some numbing and tingling around the mouth. There was no loss of consciousness.  Patient is a 29 y.o. female presenting with URI. The history is provided by the patient.  URI Presenting symptoms: congestion and cough     Past Medical History  Diagnosis Date  . Bronchitis   . Herpes     do not let any family members know of the HSV  . Depression   . Asthma   . GERD (gastroesophageal reflux disease)   . Abnormal Pap smear   . Herpes simplex     treated with Acycolvir during pregnancy  . Contraceptive management 02/28/2013  . Anxiety   . Vaginal Pap smear, abnormal    Past Surgical History  Procedure Laterality Date  . Wisdom tooth extraction     Family History  Problem Relation Age of Onset  . Anesthesia problems Neg Hx   . Cancer Mother   . Alcohol  abuse Mother   . Hypertension Mother   . Hypertension Maternal Aunt   . Heart disease Maternal Aunt   . Diabetes Maternal Uncle   . Heart disease Maternal Uncle   . Hypertension Maternal Uncle   . Hypertension Paternal Aunt   . Hypertension Paternal Uncle   . Heart attack Paternal Uncle     has stents  . Heart disease Maternal Grandmother   . Hypertension Maternal Grandmother   . Stroke Maternal Grandmother   . Heart disease Maternal Grandfather   . Hypertension Maternal Grandfather   . Stroke Maternal Grandfather   . Cancer Paternal Grandmother   . Hypertension Paternal Grandmother   . Hypertension Paternal Grandfather   . Heart murmur Son    History  Substance Use Topics  . Smoking status: Current Some Day Smoker -- 0.50 packs/day for 10 years    Types: Cigarettes  . Smokeless tobacco: Former Neurosurgeon    Types: Chew  . Alcohol Use: No   OB History    Gravida Para Term Preterm AB TAB SAB Ectopic Multiple Living   0 0 0 0 0 0 1     Review of Systems  HENT: Positive for congestion.   Respiratory: Positive for cough.   Psychiatric/Behavioral: The patient is nervous/anxious.   All other systems reviewed and are negative.     Allergies  Peanut-containing drug products and Vicodin  Home Medications   Prior  to Admission medications   Medication Sig Start Date End Date Taking? Authorizing Provider  acetaminophen (TYLENOL) 325 MG tablet Take 650 mg by mouth daily as needed. For pain    Historical Provider, MD  albuterol (VENTOLIN HFA) 108 (90 BASE) MCG/ACT inhaler Inhale 2 puffs into the lungs every 6 (six) hours as needed. For asthmatic bronchitis    Historical Provider, MD  ALPRAZolam Prudy Feeler) 1 MG tablet Take 1 mg by mouth 3 (three) times daily. 01/14/14   Historical Provider, MD  HYDROcodone-homatropine (HYCODAN) 5-1.5 MG/5ML syrup Take 5 mLs by mouth every 6 (six) hours as needed for cough. 06/15/14   Ivery Quale, PA-C  loratadine-pseudoephedrine (CLARITIN-D 12  HOUR) 5-120 MG per tablet Take 1 tablet by mouth 2 (two) times daily. 06/15/14   Ivery Quale, PA-C  omeprazole (PRILOSEC) 20 MG capsule Take 20 mg by mouth at bedtime.    Historical Provider, MD  SPRINTEC 28 0.25-35 MG-MCG tablet TAKE 1 TABLET BY MOUTH DAILY. 06/13/14   Adline Potter, NP   BP 115/70 mmHg  Pulse 62  Temp(Src) 98.3 F (36.8 C) (Oral)  Resp 20  Ht  (1.651 m)  Wt 135 lb (61.236 kg)  BMI 22.47 kg/m2  SpO2 98%  LMP 06/08/2014 (Approximate) Physical Exam  Constitutional: She is oriented to person, place, and time. She appears well-developed and well-nourished.  Non-toxic appearance.  HENT:  Head: Normocephalic.  Right Ear: Tympanic membrane and external ear normal.  Left Ear: Tympanic membrane and external ear normal.  Nasal congestion present.  The uvula is enlarged.  Eyes: EOM and lids are normal. Pupils are equal, round, and reactive to light.  Neck: Normal range of motion. Neck supple. Carotid bruit is not present.  Cardiovascular: Normal rate, regular rhythm, normal heart sounds, intact distal pulses and normal pulses.   Pulmonary/Chest: Breath sounds normal. No respiratory distress. She has no wheezes. She has no rales.  The patient speaks in complete sentences without problem.  Abdominal: Soft. Bowel sounds are normal. There is no tenderness. There is no guarding.  Musculoskeletal: Normal range of motion. She exhibits no edema or tenderness.  Lymphadenopathy:       Head (right side): No submandibular adenopathy present.       Head (left side): No submandibular adenopathy present.    She has no cervical adenopathy.  Neurological: She is alert and oriented to person, place, and time. She has normal strength. No cranial nerve deficit or sensory deficit.  Skin: Skin is warm and dry.  Psychiatric: She has a normal mood and affect. Her speech is normal.  Nursing note and vitals reviewed.   ED Course  Procedures (including critical care time) Labs  Review Labs Reviewed  URINALYSIS, ROUTINE W REFLEX MICROSCOPIC - Abnormal; Notable for the following:    Ketones, ur 15 (*)    All other components within normal limits  POC URINE PREG, ED    Imaging Review Dg Chest 2 View  06/15/2014   CLINICAL DATA:  Generalized chest pain. Upper respiratory infection. Shortness of breath. Smoker. History of anxiety. Bronchitis.  EXAM: CHEST  2 VIEW  COMPARISON:  11/15/2013  FINDINGS: Midline trachea.  Normal heart size and mediastinal contours.  Sharp costophrenic angles.  No pneumothorax.  Clear lungs.  IMPRESSION: No active cardiopulmonary disease.   Electronically Signed   By: Jeronimo Greaves M.D.   On: 06/15/2014 20:43     EKG Interpretation None      MDM  Vital signs are well within normal  limits. Pulse oximetry is 98%-100% on room air without problem . Patient speaks in complete sentences without problem. There was no more drawing of the extremities while patient was in the emergency department.  Urine pregnancy test is negative. Urinalysis reveals some ketones present in the urine, otherwise the urinalysis was within normal limits.  The chest x-ray is well within normal limits.  Suspect the patient has an upper respiratory infection that led to sensation of difficulty breathing and then a panic situation. I have discussed these findings and my suspicion with the patient in terms which he understands. Also discussed with the patient the elevation in ketones, and the need to eat balanced meals on a regular basis. The patient acknowledges understanding of these discharge instructions.    Final diagnoses:  URI (upper respiratory infection)  Anxiety    *I have reviewed nursing notes, vital signs, and all appropriate lab and imaging results for this patient.**    Ivery QualeHobson Ameya Kutz, PA-C 06/15/14 2117  Bethann BerkshireJoseph Zammit, MD 06/17/14 [phone]

## 2014-06-15 NOTE — ED Notes (Signed)
Uri symptoms not relieved by own inhaler. Pt hyperventilating with carpel spasms upon triage

## 2014-06-15 NOTE — Discharge Instructions (Signed)
Please increase fluids. There are ketones in your urine. Please each her meals on time. Please use Claritin-D every 12 hours for congestion, please use Hycodan every 4 or 6 hours for cough and congestion. Cough, Adult  A cough is a reflex that helps clear your throat and airways. It can help heal the body or may be a reaction to an irritated airway. A cough may only last 2 or 3 weeks (acute) or may last more than 8 weeks (chronic).  CAUSES Acute cough:  Viral or bacterial infections. Chronic cough:  Infections.  Allergies.  Asthma.  Post-nasal drip.  Smoking.  Heartburn or acid reflux.  Some medicines.  Chronic lung problems (COPD).  Cancer. SYMPTOMS   Cough.  Fever.  Chest pain.  Increased breathing rate.  High-pitched whistling sound when breathing (wheezing).  Colored mucus that you cough up (sputum). TREATMENT   A bacterial cough may be treated with antibiotic medicine.  A viral cough must run its course and will not respond to antibiotics.  Your caregiver may recommend other treatments if you have a chronic cough. HOME CARE INSTRUCTIONS   Only take over-the-counter or prescription medicines for pain, discomfort, or fever as directed by your caregiver. Use cough suppressants only as directed by your caregiver.  Use a cold steam vaporizer or humidifier in your bedroom or home to help loosen secretions.  Sleep in a semi-upright position if your cough is worse at night.  Rest as needed.  Stop smoking if you smoke. SEEK IMMEDIATE MEDICAL CARE IF:   You have pus in your sputum.  Your cough starts to worsen.  You cannot control your cough with suppressants and are losing sleep.  You begin coughing up blood.  You have difficulty breathing.  You develop pain which is getting worse or is uncontrolled with medicine.  You have a fever. MAKE SURE YOU:   Understand these instructions.  Will watch your condition.  Will get help right away if you are  not doing well or get worse. Document Released: 08/29/2010 Document Revised: 05/25/2011 Document Reviewed: 08/29/2010 [facility] Patient Information 2015 [place]. This information is not intended to replace advice given to you by your health care provider. Make sure you discuss any questions you have with your health care provider.

## 2015-02-14 ENCOUNTER — Other Ambulatory Visit: Payer: Self-pay | Admitting: Adult Health

## 2015-03-05 ENCOUNTER — Other Ambulatory Visit (HOSPITAL_COMMUNITY)
Admission: RE | Admit: 2015-03-05 | Discharge: 2015-03-05 | Disposition: A | Discharge status: Home / Self Care | Payer: Medicaid Other | Source: Ambulatory Visit | Attending: Obstetrics & Gynecology | Admitting: Obstetrics & Gynecology

## 2015-03-05 ENCOUNTER — Ambulatory Visit (INDEPENDENT_AMBULATORY_CARE_PROVIDER_SITE_OTHER): Payer: Medicaid Other | Admitting: Women's Health

## 2015-03-05 ENCOUNTER — Encounter: Payer: Self-pay | Admitting: Women's Health

## 2015-03-05 VITALS — BP 130/90 | HR 64 | Ht 65.0 in

## 2015-03-05 DIAGNOSIS — Z01411 Encounter for gynecological examination (general) (routine) with abnormal findings: Secondary | ICD-10-CM | POA: Diagnosis present

## 2015-03-05 DIAGNOSIS — Z01419 Encounter for gynecological examination (general) (routine) without abnormal findings: Secondary | ICD-10-CM

## 2015-03-05 DIAGNOSIS — O99330 Smoking (tobacco) complicating pregnancy, unspecified trimester: Secondary | ICD-10-CM | POA: Insufficient documentation

## 2015-03-05 DIAGNOSIS — Z113 Encounter for screening for infections with a predominantly sexual mode of transmission: Secondary | ICD-10-CM | POA: Insufficient documentation

## 2015-03-05 DIAGNOSIS — Z0000 Encounter for general adult medical examination without abnormal findings: Secondary | ICD-10-CM | POA: Diagnosis not present

## 2015-03-05 DIAGNOSIS — B9689 Other specified bacterial agents as the cause of diseases classified elsewhere: Secondary | ICD-10-CM

## 2015-03-05 DIAGNOSIS — A499 Bacterial infection, unspecified: Secondary | ICD-10-CM | POA: Diagnosis not present

## 2015-03-05 DIAGNOSIS — N760 Acute vaginitis: Secondary | ICD-10-CM

## 2015-03-05 DIAGNOSIS — N898 Other specified noninflammatory disorders of vagina: Secondary | ICD-10-CM

## 2015-03-05 DIAGNOSIS — F17200 Nicotine dependence, unspecified, uncomplicated: Secondary | ICD-10-CM

## 2015-03-05 LAB — POCT WET PREP (WET MOUNT): Clue Cells Wet Prep Whiff POC: POSITIVE

## 2015-03-05 MED ORDER — METRONIDAZOLE 500 MG PO TABS: 500.0000 mg | ORAL_TABLET | Freq: Two times a day (BID) | ORAL | Status: DC

## 2015-03-05 NOTE — Progress Notes (Signed)
Patient ID: Tracy Choi, female   DOB: 07/29/1985, 29 y.o.   MRN: [id number] Subjective:   Tracy Sellsndrea D Stief is a 29 y.o. 311P1001 Caucasian female here for a routine well-woman exam.  Patient's last menstrual period was 02/25/2015.    Current complaints: none PCP: [facility]       Does not desire labs  Social History: Sexual: heterosexual Marital Status: single Living situation: by self with child Occupation: Nature conservation officerstocker @ [employer] Tobacco/alcohol: smokes 1/2ppd, not interested in quitting, no etoh Illicit drugs: THC  The following portions of the patient's history were reviewed and updated as appropriate: allergies, current medications, past family history, past medical history, past social history, past surgical history and problem list.  Past Medical History Past Medical History  Diagnosis Date  . Bronchitis   . Herpes     do not let any family members know of the HSV  . Depression   . Asthma   . GERD (gastroesophageal reflux disease)   . Abnormal Pap smear   . Herpes simplex     treated with Acycolvir during pregnancy  . Contraceptive management 02/28/2013  . Anxiety   . Vaginal Pap smear, abnormal     Past Surgical History Past Surgical History  Procedure Laterality Date  . Wisdom tooth extraction      Gynecologic History G1P1001  Patient's last menstrual period was 02/25/2015. Contraception: OCP (estrogen/progesterone) Last Pap: 2014. Results were: normal, but pt wishes to have pap done today Last mammogram: never. Results were: n/a Last TCS: never  Obstetric History OB History  Gravida Para Term Preterm AB SAB TAB Ectopic Multiple Living  1 1 1  0 0 0 0 0 0 1    # Outcome Date GA Lbr Len/2nd Weight Sex Delivery Anes PTL Lv  1 Term 08/27/11 [id number] 09:01 / 00:27 7 lb 5.8 oz (3.34 kg) M Vag-Spont EPI  Y     Comments: molding, but within normal limits      Current Medications Current Outpatient Prescriptions on File Prior to Visit  Medication Sig Dispense  Refill  . acetaminophen (TYLENOL) 325 MG tablet Take 650 mg by mouth daily as needed. For pain    . albuterol (VENTOLIN HFA) 108 (90 BASE) MCG/ACT inhaler Inhale 2 puffs into the lungs every 6 (six) hours as needed. For asthmatic bronchitis    . SPRINTEC 28 0.25-35 MG-MCG tablet TAKE 1 TABLET BY MOUTH DAILY. 28 tablet 6   No current facility-administered medications on file prior to visit.    Review of Systems Patient denies any headaches, blurred vision, shortness of breath, chest pain, abdominal pain, problems with bowel movements, urination, or intercourse.  Objective:  BP 130/90 mmHg  Pulse 64  Ht 5\' 5"  (1.651 m)  LMP 02/25/2015 Physical Exam  General:  Well developed, well nourished, no acute distress. She is alert and oriented x3. Skin:  Warm and dry Neck:  Midline trachea, no thyromegaly or nodules Cardiovascular: Regular rate and rhythm, no murmur heard Lungs:  Effort normal, all lung fields clear to auscultation bilaterally Breasts:  No dominant palpable mass, retraction, or nipple discharge Abdomen:  Soft, non tender, no hepatosplenomegaly or masses Pelvic:  External genitalia is normal in appearance.  The vagina is normal in appearance, + malodorous d/c. The cervix is bulbous, no CMT.  Thin prep pap is done w/ reflex HR HPV cotesting. Uterus is felt to be normal size, shape, and contour.  No adnexal masses or tenderness noted. Extremities:  No swelling or varicosities noted  Psych:  She has a normal mood and affect  Assessment:   Healthy well-woman exam BV Smoker THC use  Plan:  Rx metronidazole  BID x 7d for BV, no sex or etoh while taking  Advised smoking and THC cessation, not ready to quit F/U 55yr for physical, or sooner if needed Mammogram  or sooner if problems Colonoscopy  or sooner if problems  Marge Duncans CNM, [facility] 03/05/2015 4:15 PM

## 2015-03-07 LAB — CYTOLOGY - PAP

## 2015-07-09 IMAGING — DX DG CHEST 2V
2 series · 2 of 2 positions shown · non-contrast
Comparison: 11/15/2013

CLINICAL DATA: Generalized chest pain. Upper respiratory infection.
Shortness of breath. Smoker. History of anxiety. Bronchitis.

EXAM:
CHEST  2 VIEW

[chest pa]
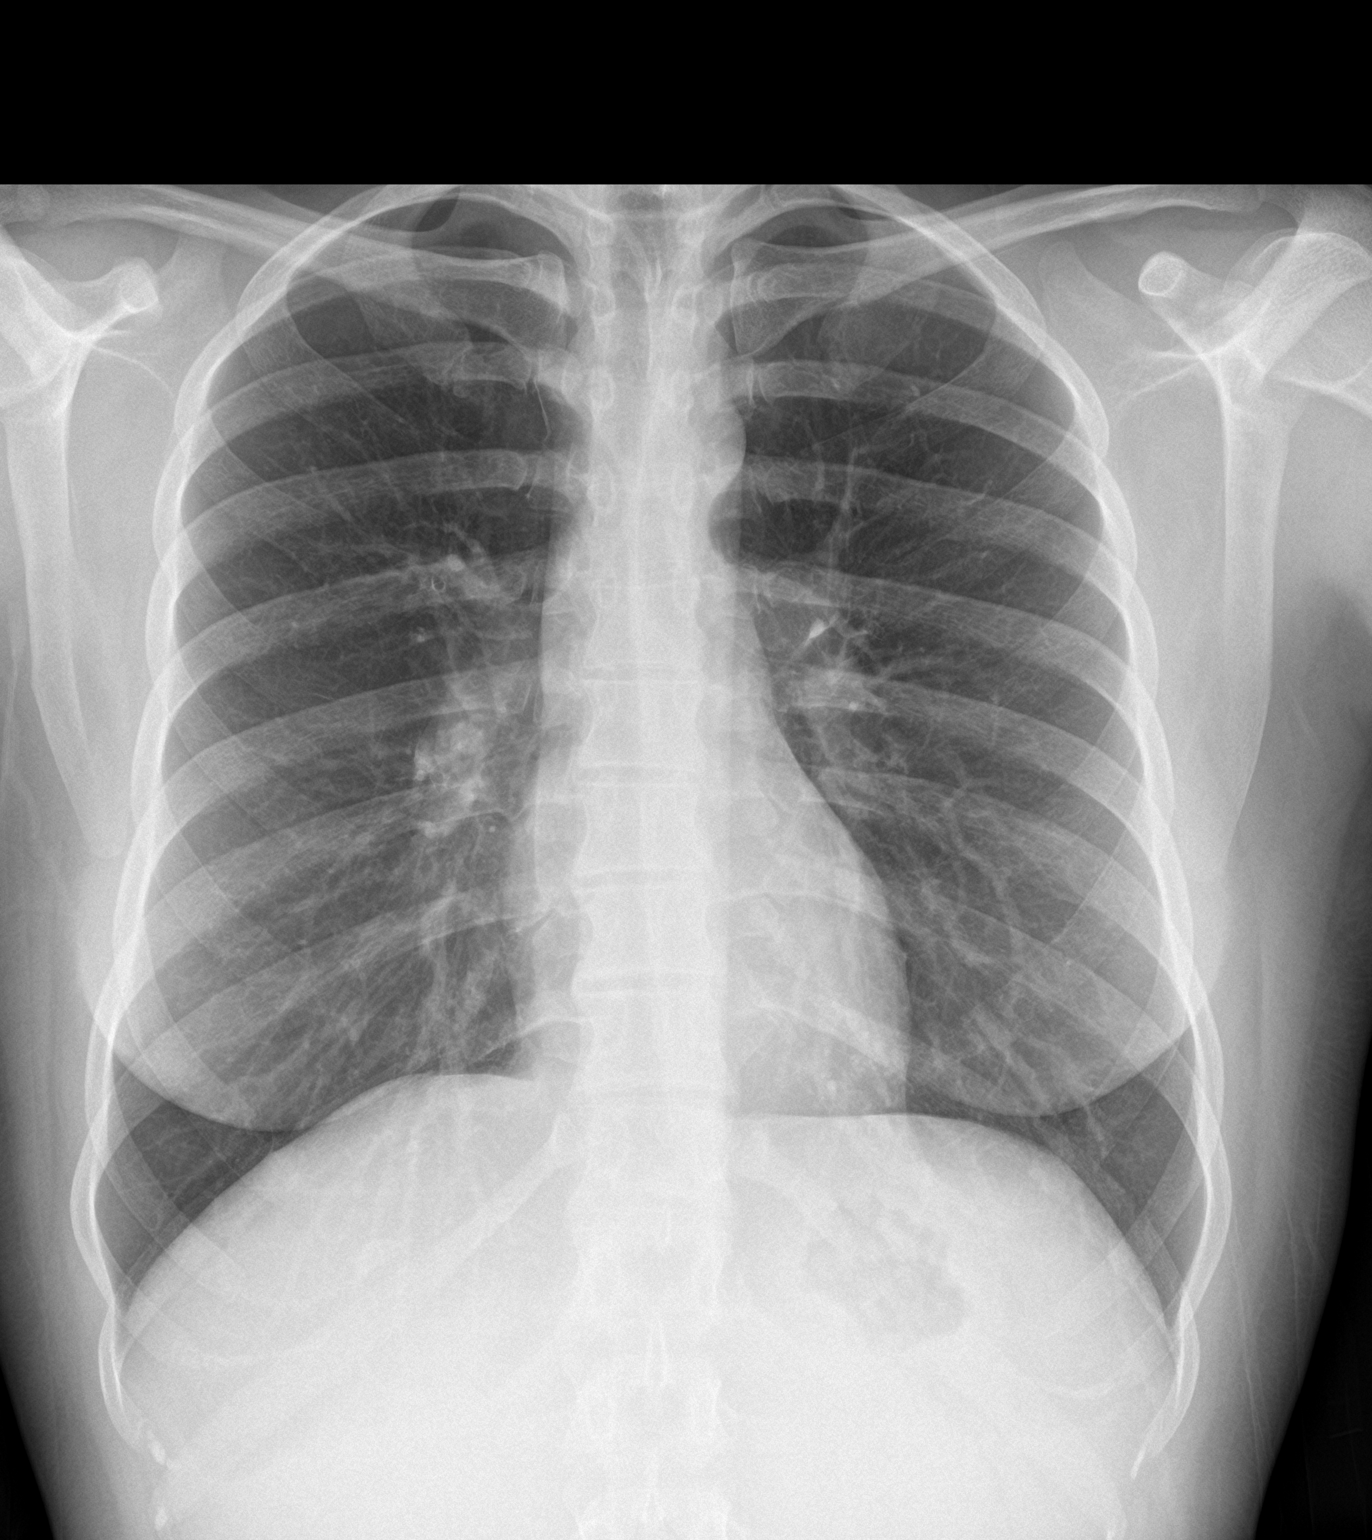

[chest lat]
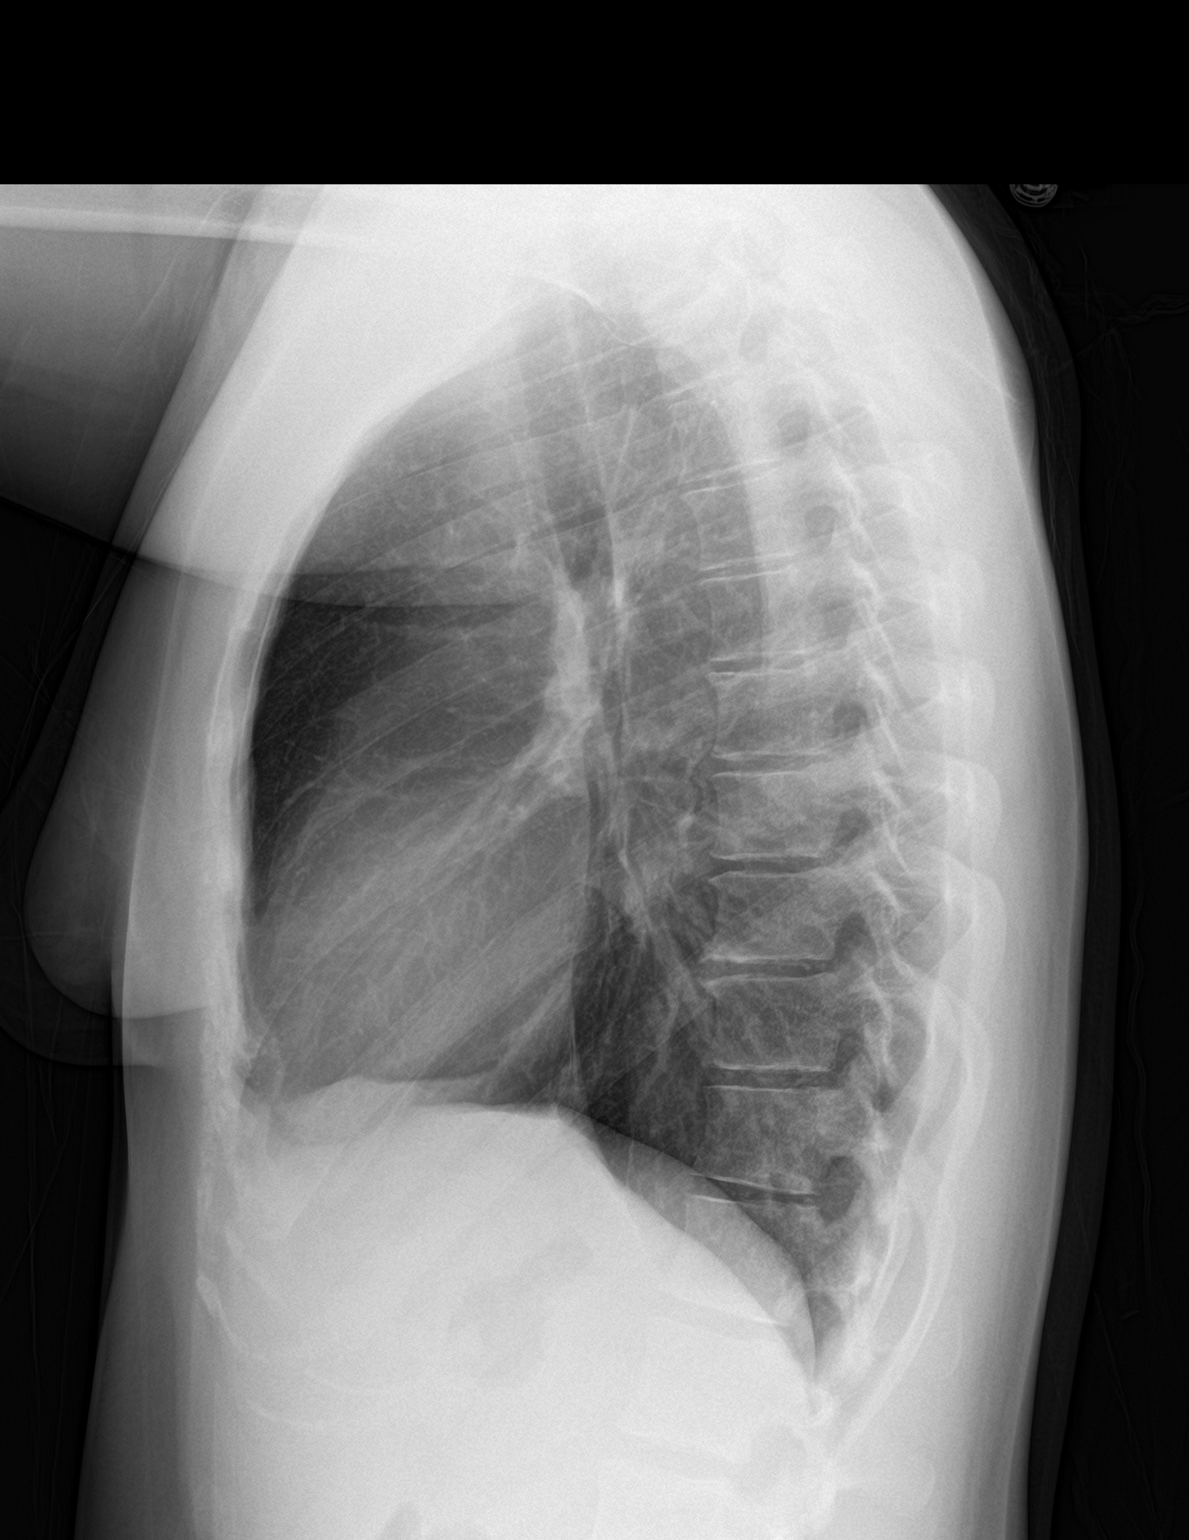

[2 of 2 positions shown; findings below may reference images not displayed]

FINDINGS: Midline trachea.  Normal heart size and mediastinal contours.

Sharp costophrenic angles.  No pneumothorax.  Clear lungs.
IMPRESSION: No active cardiopulmonary disease.

## 2015-11-19 ENCOUNTER — Other Ambulatory Visit: Payer: Self-pay | Admitting: Adult Health

## 2016-03-11 ENCOUNTER — Encounter: Payer: Self-pay | Admitting: Adult Health

## 2016-03-11 ENCOUNTER — Encounter (INDEPENDENT_AMBULATORY_CARE_PROVIDER_SITE_OTHER): Payer: Self-pay

## 2016-03-11 ENCOUNTER — Ambulatory Visit (INDEPENDENT_AMBULATORY_CARE_PROVIDER_SITE_OTHER): Payer: Medicaid Other | Admitting: Adult Health

## 2016-03-11 VITALS — BP 140/90 | HR 82 | Ht 65.0 in | Wt 178.0 lb

## 2016-03-11 DIAGNOSIS — Z308 Encounter for other contraceptive management: Secondary | ICD-10-CM

## 2016-03-11 DIAGNOSIS — Z01419 Encounter for gynecological examination (general) (routine) without abnormal findings: Secondary | ICD-10-CM

## 2016-03-11 DIAGNOSIS — Z3041 Encounter for surveillance of contraceptive pills: Secondary | ICD-10-CM

## 2016-03-11 DIAGNOSIS — N898 Other specified noninflammatory disorders of vagina: Secondary | ICD-10-CM

## 2016-03-11 DIAGNOSIS — Z3009 Encounter for other general counseling and advice on contraception: Secondary | ICD-10-CM

## 2016-03-11 MED ORDER — METRONIDAZOLE 500 MG PO TABS: 500.0000 mg | ORAL_TABLET | Freq: Two times a day (BID) | ORAL | 0 refills | Status: DC

## 2016-03-11 MED ORDER — NORGESTIMATE-ETH ESTRADIOL 0.25-35 MG-MCG PO TABS: 1.0000 | ORAL_TABLET | Freq: Every day | ORAL | 12 refills | Status: DC

## 2016-03-11 NOTE — Patient Instructions (Signed)
Physical in 1 year 

## 2016-03-11 NOTE — Progress Notes (Signed)
History of Present Illness: Tracy Choi is a 30 year old white female in for a well woman gyn exam, she had a normal pap 03/05/15. She has family planning medicaid and complains of vaginal discharge with odor, for about 3 months, has period now.     Current Medications, Allergies, Past Medical History, Past Surgical History, Family History and Social History were reviewed in [facility] electronic medical record.     Review of Systems:  Patient denies any headaches, hearing loss, fatigue, blurred vision, shortness of breath, chest pain, abdominal pain, problems with bowel movements, urination, or intercourse. No joint pain or mood swings.+vaginal discharge   Physical Exam:BP 140/90 (BP Location: Left Arm, Patient Position: Sitting, Cuff Size: Normal)   Pulse 82   Ht 5\' 5"  (1.651 m)   Wt 178 lb (80.7 kg)   LMP 03/10/2016 (Exact Date)   BMI 29.62 kg/m  General:  Well developed, well nourished, no acute distress Skin:  Warm and dry Neck:  Midline trachea, normal thyroid, good ROM, no lymphadenopathy Lungs; Clear to auscultation bilaterally Breast:  No dominant palpable mass, retraction, or nipple discharge Cardiovascular: Regular rate and rhythm Abdomen:  Soft, non tender, no hepatosplenomegaly Pelvic:  External genitalia is normal in appearance, no lesions.  The vagina is normal in appearance,with period blood. Urethra has no lesions or masses. The cervix is bulbous.  Uterus is felt to be normal size, shape, and contour.  No adnexal masses or tenderness noted.Bladder is non tender, no masses felt.GC/CHL obtained. Extremities/musculoskeletal:  No swelling or varicosities noted, no clubbing or cyanosis Psych:  No mood changes, alert and cooperative,seems happy PHQ 2 score 0. She is aware wil have to stop smoking or change pills at 37.Will go ahead and treat for BV, too much blood for wet prep.  Impression:  1. Well woman exam with routine gynecological exam   2. Vaginal discharge    3. Encounter for surveillance of contraceptive pills   4. Family planning      Plan: Rx flagyl 500 mg 1 bid x 7 days, no alcohol Rx sprintec, disp 1 pack take 1 daily with 12 refills GC/CHL sent Check HIV and RPR Physical in 1 year

## 2016-03-12 LAB — HIV ANTIBODY (ROUTINE TESTING W REFLEX): HIV Screen 4th Generation wRfx: NONREACTIVE

## 2016-03-12 LAB — RPR: RPR: NONREACTIVE

## 2016-03-13 LAB — GC/CHLAMYDIA PROBE AMP
Chlamydia trachomatis, NAA: NEGATIVE
Neisseria gonorrhoeae by PCR: NEGATIVE

## 2017-03-12 ENCOUNTER — Other Ambulatory Visit (HOSPITAL_COMMUNITY)
Admission: RE | Admit: 2017-03-12 | Discharge: 2017-03-12 | Disposition: A | Discharge status: Home / Self Care | Payer: Medicaid Other | Source: Ambulatory Visit | Attending: Adult Health | Admitting: Adult Health

## 2017-03-12 ENCOUNTER — Ambulatory Visit (INDEPENDENT_AMBULATORY_CARE_PROVIDER_SITE_OTHER): Payer: Medicaid Other | Admitting: Adult Health

## 2017-03-12 ENCOUNTER — Encounter: Payer: Self-pay | Admitting: Adult Health

## 2017-03-12 VITALS — BP 118/80 | HR 83 | Ht 65.0 in | Wt 155.0 lb

## 2017-03-12 DIAGNOSIS — Z309 Encounter for contraceptive management, unspecified: Secondary | ICD-10-CM | POA: Diagnosis not present

## 2017-03-12 DIAGNOSIS — Z113 Encounter for screening for infections with a predominantly sexual mode of transmission: Secondary | ICD-10-CM | POA: Diagnosis present

## 2017-03-12 DIAGNOSIS — Z3041 Encounter for surveillance of contraceptive pills: Secondary | ICD-10-CM

## 2017-03-12 DIAGNOSIS — Z3009 Encounter for other general counseling and advice on contraception: Secondary | ICD-10-CM

## 2017-03-12 DIAGNOSIS — Z01419 Encounter for gynecological examination (general) (routine) without abnormal findings: Secondary | ICD-10-CM

## 2017-03-12 MED ORDER — NORGESTIMATE-ETH ESTRADIOL 0.25-35 MG-MCG PO TABS: 1.0000 | ORAL_TABLET | Freq: Every day | ORAL | 12 refills | Status: DC

## 2017-03-12 NOTE — Progress Notes (Signed)
Patient ID: Tracy Choi, female   DOB: 05/07/1985, 31 y.o.   MRN: [id number] History of Present Illness: Tracy Choi is a 31 year old white female in for a well woman gyn exam and pap, she had family planning medicaid.    Current Medications, Allergies, Past Medical History, Past Surgical History, Family History and Social History were reviewed in [facility] electronic medical record.     Review of Systems:  Patient denies any headaches, hearing loss, blurred vision, shortness of breath, chest pain, abdominal pain, problems with bowel movements, urination, or intercourse. No joint pain or mood swings. Tired at times,but not depressed.  Physical Exam:BP 118/80 (BP Location: Left Arm, Patient Position: Sitting, Cuff Size: Normal)   Pulse 83   Ht 5\' 5"  (1.651 m)   Wt 155 lb (70.3 kg)   LMP 02/13/2017 (Approximate)   BMI 25.79 kg/m  General:  Well developed, well nourished, no acute distress Skin:  Warm and dry Neck:  Midline trachea, normal thyroid, good ROM, no lymphadenopathy Lungs; Clear to auscultation bilaterally Breast:  No dominant palpable mass, retraction, or nipple discharge Cardiovascular: Regular rate and rhythm Abdomen:  Soft, non tender, no hepatosplenomegaly Pelvic:  External genitalia is normal in appearance, no lesions.  The vagina is normal in appearance. Urethra has no lesions or masses. The cervix is bulbous.Pap with GC/CHL and HPV performed.  Uterus is felt to be normal size, shape, and contour.  No adnexal masses or tenderness noted.Bladder is non tender, no masses felt. Extremities/musculoskeletal:  No swelling or varicosities noted, no clubbing or cyanosis Psych:  No mood changes, alert and cooperative,seems happy PHQ 2 score 0.  Impression: 1. Well woman exam with routine gynecological exam   2. Family planning   3. Encounter for surveillance of contraceptive pills   4. Screening examination for STD (sexually transmitted disease)       Plan: Check  HIV and RPR Meds ordered this encounter  Medications  . norgestimate-ethinyl estradiol (SPRINTEC 28) 0.25-35 MG-MCG tablet    Sig: Take 1 tablet by mouth daily.    Dispense:  28 tablet    Refill:  12    Order Specific Question:   Supervising Provider    Answer:   Lazaro ArmsEURE, LUTHER H [[id number]]  Physical in 1 year Pap in 3 if normal

## 2017-03-13 LAB — HIV ANTIBODY (ROUTINE TESTING W REFLEX): HIV Screen 4th Generation wRfx: NONREACTIVE

## 2017-03-13 LAB — RPR: RPR Ser Ql: NONREACTIVE

## 2017-03-17 LAB — CYTOLOGY - PAP
CHLAMYDIA, DNA PROBE: NEGATIVE
Diagnosis: NEGATIVE
HPV: NOT DETECTED
Neisseria Gonorrhea: NEGATIVE

## 2017-04-18 ENCOUNTER — Emergency Department (HOSPITAL_COMMUNITY)
Admission: EM | Admit: 2017-04-18 | Discharge: 2017-04-18 | Disposition: A | Discharge status: Home / Self Care | Payer: BLUE CROSS/BLUE SHIELD | Attending: Emergency Medicine | Admitting: Emergency Medicine

## 2017-04-18 ENCOUNTER — Other Ambulatory Visit: Payer: Self-pay

## 2017-04-18 ENCOUNTER — Encounter (HOSPITAL_COMMUNITY): Payer: Self-pay | Admitting: *Deleted

## 2017-04-18 DIAGNOSIS — H8113 Benign paroxysmal vertigo, bilateral: Secondary | ICD-10-CM | POA: Diagnosis not present

## 2017-04-18 DIAGNOSIS — H8110 Benign paroxysmal vertigo, unspecified ear: Secondary | ICD-10-CM | POA: Insufficient documentation

## 2017-04-18 DIAGNOSIS — J45909 Unspecified asthma, uncomplicated: Secondary | ICD-10-CM | POA: Insufficient documentation

## 2017-04-18 DIAGNOSIS — R110 Nausea: Secondary | ICD-10-CM | POA: Diagnosis not present

## 2017-04-18 DIAGNOSIS — R42 Dizziness and giddiness: Secondary | ICD-10-CM | POA: Diagnosis not present

## 2017-04-18 DIAGNOSIS — Z91010 Allergy to peanuts: Secondary | ICD-10-CM | POA: Insufficient documentation

## 2017-04-18 DIAGNOSIS — Z79899 Other long term (current) drug therapy: Secondary | ICD-10-CM | POA: Diagnosis not present

## 2017-04-18 DIAGNOSIS — F17210 Nicotine dependence, cigarettes, uncomplicated: Secondary | ICD-10-CM | POA: Insufficient documentation

## 2017-04-18 MED ORDER — FLUTICASONE PROPIONATE 50 MCG/ACT NA SUSP: 2.0000 | Freq: Every day | NASAL | 0 refills | Status: DC

## 2017-04-18 MED ORDER — ONDANSETRON 4 MG PO TBDP
4.0000 mg | ORAL_TABLET | Freq: Once | ORAL | Status: AC
  Administered 2017-04-18: 4 mg via ORAL
  Filled 2017-04-18: qty 1

## 2017-04-18 MED ORDER — MECLIZINE HCL 12.5 MG PO TABS
25.0000 mg | ORAL_TABLET | Freq: Once | ORAL | Status: AC
  Administered 2017-04-18: 25 mg via ORAL
  Filled 2017-04-18: qty 2

## 2017-04-18 MED ORDER — ONDANSETRON HCL 4 MG PO TABS: 4.0000 mg | ORAL_TABLET | Freq: Three times a day (TID) | ORAL | 0 refills | Status: DC | PRN

## 2017-04-18 MED ORDER — MECLIZINE HCL 25 MG PO TABS: 25.0000 mg | ORAL_TABLET | Freq: Three times a day (TID) | ORAL | 0 refills | Status: DC | PRN

## 2017-04-18 NOTE — ED Provider Notes (Signed)
[facility] EMERGENCY DEPARTMENT Provider Note   CSN: [id number] Arrival date & time: 04/18/17  4098     History   Chief Complaint Chief Complaint  Patient presents with  . Dizziness    HPI Tracy Choi is a 31 y.o. female presenting for evaluation of dizziness.  Pt states that for the past 3-4 days, she has had nasal congestion and head cold.  She has associated cough, ear fullness, and sinus pressure.  Yesterday, she woke up with dizziness when she turns her head.  This is present only when she moves her head.  Dizziness is described as the room spinning.  She denies fall, trauma, or injury.  She denies head pain.  Denies vision changes, slurred speech, numbness, or tingling.  She reports associated nausea without vomiting.  She denies fevers, chills, sore throat, cough, urinary symptoms, abnormal bowel movements.  She denies history of similar.   HPI  Past Medical History:  Diagnosis Date  . Abnormal Pap smear   . Anxiety   . Asthma   . Bronchitis   . Contraceptive management 02/28/2013  . Depression   . GERD (gastroesophageal reflux disease)   . Herpes    do not let any family members know of the HSV  . 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 Vaginal Pap smear, abnormal     Patient Active Problem List   Diagnosis Date Noted  . Smoker 03/05/2015  . BV (bacterial vaginosis) 03/05/2015  . Contraceptive management 02/28/2013  . Menorrhagia 02/28/2013  . Reflux 02/28/2013  . Back pain 02/14/2013    Past Surgical History:  Procedure Laterality Date  . WISDOM TOOTH EXTRACTION      OB History    Gravida Para Term Preterm AB Living   1 1 1  0 0 1   SAB TAB Ectopic Multiple Live Births   0 0 0 0 1       Home Medications    Prior to Admission medications   Medication Sig Start Date End Date Taking? Authorizing Provider  acetaminophen (TYLENOL) 325 MG tablet Take 650 mg by mouth daily as needed. For pain    [provider]    acyclovir (ZOVIRAX) 400 MG tablet Take 400 mg by mouth 2 (two) times daily.    [provider]  albuterol (VENTOLIN HFA) 108 (90 BASE) MCG/ACT inhaler Inhale 2 puffs into the lungs every 6 (six) hours as needed. For asthmatic bronchitis    [provider]  cyclobenzaprine (FLEXERIL) 10 MG tablet TAKE 1 TABLET BY MOUTH THREE TIMES DAILY AS NEEDED FOR 14 DAYS 02/28/16   [provider]  diphenhydrAMINE (BENADRYL) 25 MG tablet Take 25 mg by mouth every 6 (six) hours as needed.    [provider]  fluticasone (FLONASE) 50 MCG/ACT nasal spray Place 2 sprays into both nostrils daily. 04/18/17   Sydell Prowell, PA-C  meclizine (ANTIVERT) 25 MG tablet Take 1 tablet (25 mg total) by mouth 3 (three) times daily as needed for dizziness. 04/18/17   Joliyah Lippens, PA-C  norgestimate-ethinyl estradiol (SPRINTEC 28) 0.25-35 MG-MCG tablet Take 1 tablet by mouth daily. 03/12/17   Adline Potter, NP  ondansetron (ZOFRAN) 4 MG tablet Take 1 tablet (4 mg total) by mouth every 8 (eight) hours as needed for nausea or vomiting. 04/18/17   Della Scrivener, PA-C    Family History Family History  Problem Relation Age of Onset  . Cancer Mother   . Alcohol abuse Mother   .  Hypertension Mother   . Hypertension Maternal Aunt   . Heart disease Maternal Aunt   . Diabetes Maternal Uncle   . Heart disease Maternal Uncle   . Hypertension Maternal Uncle   . Hypertension Paternal Aunt   . Hypertension Paternal Uncle   . Heart attack Paternal Uncle        has stents  . Heart disease Maternal Grandmother   . Hypertension Maternal Grandmother   . Stroke Maternal Grandmother   . Heart disease Maternal Grandfather   . Hypertension Maternal Grandfather   . Stroke Maternal Grandfather   . Cancer Paternal Grandmother   . Hypertension Paternal Grandmother   . Hypertension Paternal Grandfather   . Heart murmur Son   . Anesthesia problems Neg Hx     Social History Social History    Tobacco Use  . Smoking status: Current Some Day Smoker    Packs/day: 0.50    Years: 15.00    Pack years: 7.50    Types: Cigarettes  . Smokeless tobacco: Former Neurosurgeon    Types: Chew  Substance Use Topics  . Alcohol use: No  . Drug use: Yes    Types: Marijuana    Comment: last used 04/17/17     Allergies   Peanut-containing drug products and Vicodin [hydrocodone-acetaminophen]   Review of Systems Review of Systems  Gastrointestinal: Positive for nausea.  Neurological: Positive for dizziness.  All other systems reviewed and are negative.    Physical Exam Updated Vital Signs BP 115/77 (BP Location: Left Arm)   Pulse 66   Temp 98.2 F (36.8 C) (Oral)   Resp 20   Ht 5\' 5"  (1.651 m)   Wt 63.5 kg (140 lb)   LMP  (Within Weeks)   SpO2 100%   BMI 23.30 kg/m   Physical Exam  Constitutional: She is oriented to person, place, and time. She appears well-developed and well-nourished. No distress.  HENT:  Head: Normocephalic and atraumatic.  Right Ear: External ear and ear canal normal. Tympanic membrane is bulging. Tympanic membrane is not erythematous.  Left Ear: External ear and ear canal normal. Tympanic membrane is bulging. Tympanic membrane is not erythematous.  Nose: Mucosal edema and rhinorrhea present. Right sinus exhibits no maxillary sinus tenderness and no frontal sinus tenderness. Left sinus exhibits no maxillary sinus tenderness and no frontal sinus tenderness.  Mouth/Throat: Uvula is midline, oropharynx is clear and moist and mucous membranes are normal.  Eyes: Conjunctivae and EOM are normal. Pupils are equal, round, and reactive to light.  EOMI and PERRLA. No nystagmus.   Neck: Normal range of motion. Neck supple.  Cardiovascular: Normal rate, regular rhythm and intact distal pulses.  Pulmonary/Chest: Effort normal and breath sounds normal. No respiratory distress. She has no wheezes.  Abdominal: Soft. She exhibits no distension and no mass. There is no  tenderness. There is no guarding.  Musculoskeletal: Normal range of motion.  Neurological: She is alert and oriented to person, place, and time. She has normal strength. No cranial nerve deficit or sensory deficit. Coordination normal. GCS eye subscore is 4. GCS verbal subscore is 5. GCS motor subscore is 6.  Fine movement and coordination intact.  Skin: Skin is warm and dry.  Psychiatric: She has a normal mood and affect.  Nursing note and vitals reviewed.    ED Treatments / Results  Labs (all labs ordered are listed, but only abnormal results are displayed) Labs Reviewed - No data to display  EKG  EKG Interpretation  Date/Time:  "Sunday April 18 2017 08:42:20 EST Ventricular Rate:  56 PR Interval:    QRS Duration: 99 QT Interval:  425 QTC Calculation: 411 R Axis:   38 Text Interpretation:  Sinus rhythm Short PR interval RSR' in V1 or V2, right VCD or RVH Since last tracing rate slower Confirmed by Miller, Brian ([id number]) on 04/18/2017 9:46:16 AM       Radiology No results found.  Procedures Procedures (including critical care time)  Medications Ordered in ED Medications  ondansetron (ZOFRAN-ODT) disintegrating tablet 4 mg (4 mg Oral Given 04/18/17 0925)  meclizine (ANTIVERT) tablet 25 mg (25 mg Oral Given 04/18/17 0925)     Initial Impression / Assessment and Plan / ED Course  I have reviewed the triage vital signs and the nursing notes.  Pertinent labs & imaging results that were available during my care of the patient were reviewed by me and considered in my medical decision making (see chart for details).     Patient presenting for evaluation of dizziness present with movement of the head.  Physical exam reassuring, no gross neurologic deficits.  No dizziness at rest.  Patient with recent head cold. Doubt stroke, cardiac etiology, or injury.  Likely BPPV.  Will give Zofran and meclizine and reassess.  EKG reassuring, no sign of STEMI.  case discussed with attending,  Dr. Milelr agrees to plan.   On reassessment, patient reports nausea is resolved and dizziness is improved.  Discussed BPPV and symptommatic treatment.  Follow-up with primary care for further evaluation at the end of this week.  At this time, patient appears safe for discharge.  Return precautions given.  Patient states she understands and agrees to plan.   Final Clinical Impressions(s) / ED Diagnoses   Final diagnoses:  Benign paroxysmal positional vertigo, unspecified laterality    ED Discharge Orders        Ordered    meclizine (ANTIVERT) 25 MG tablet  3 times daily PRN     04/18/17 1000    fluticasone (FLONASE) 50 MCG/ACT nasal spray  Daily     04/18/17 1000    ondansetron (ZOFRAN) 4 MG tablet  Every 8 hours PRN     02" /03/19 1000       Alveria ApleyCaccavale, Cathyrn Deas, PA-C 04/18/17 1633    Eber HongMiller, Brian, MD 04/19/17 [phone]

## 2017-04-18 NOTE — Discharge Instructions (Signed)
Use Flonase twice a day until symptoms resolve. Use meclizine up to 3 times a day as needed for dizziness or nausea. Take Zofran up to 3 times a day as needed for nausea or vomiting. Use Sudafed to help with nasal congestion until symptoms resolve. It is very important that you stay well-hydrated with water while taking all these medications. Continue taking all your other at home medications as prescribed. Follow-up with your primary care doctor for reevaluation at the end of this week. Return to the emergency room if you develop vision changes, slurred speech, numbness, or any new or worsening symptoms.

## 2017-04-18 NOTE — ED Triage Notes (Signed)
Pt c/o dizziness, "room spinning" that started yesterday. Pt reports she feels worse when her head moves around. Denies pain.

## 2017-04-18 NOTE — ED Notes (Signed)
ED Provider at bedside. 

## 2017-04-27 DIAGNOSIS — F341 Dysthymic disorder: Secondary | ICD-10-CM | POA: Diagnosis not present

## 2017-04-27 DIAGNOSIS — R42 Dizziness and giddiness: Secondary | ICD-10-CM | POA: Diagnosis not present

## 2017-04-27 DIAGNOSIS — Z6824 Body mass index (BMI) 24.0-24.9, adult: Secondary | ICD-10-CM | POA: Diagnosis not present

## 2017-04-27 DIAGNOSIS — Z1389 Encounter for screening for other disorder: Secondary | ICD-10-CM | POA: Diagnosis not present

## 2017-06-10 DIAGNOSIS — M75101 Unspecified rotator cuff tear or rupture of right shoulder, not specified as traumatic: Secondary | ICD-10-CM | POA: Diagnosis not present

## 2017-06-10 DIAGNOSIS — M25511 Pain in right shoulder: Secondary | ICD-10-CM | POA: Diagnosis not present

## 2017-06-10 DIAGNOSIS — F419 Anxiety disorder, unspecified: Secondary | ICD-10-CM | POA: Diagnosis not present

## 2017-06-10 DIAGNOSIS — Z6824 Body mass index (BMI) 24.0-24.9, adult: Secondary | ICD-10-CM | POA: Diagnosis not present

## 2017-06-10 DIAGNOSIS — Z1389 Encounter for screening for other disorder: Secondary | ICD-10-CM | POA: Diagnosis not present

## 2017-09-07 DIAGNOSIS — F419 Anxiety disorder, unspecified: Secondary | ICD-10-CM | POA: Diagnosis not present

## 2017-09-07 DIAGNOSIS — Z6822 Body mass index (BMI) 22.0-22.9, adult: Secondary | ICD-10-CM | POA: Diagnosis not present

## 2017-09-07 DIAGNOSIS — Z1389 Encounter for screening for other disorder: Secondary | ICD-10-CM | POA: Diagnosis not present

## 2017-09-07 DIAGNOSIS — Z719 Counseling, unspecified: Secondary | ICD-10-CM | POA: Diagnosis not present

## 2017-12-24 DIAGNOSIS — Z6822 Body mass index (BMI) 22.0-22.9, adult: Secondary | ICD-10-CM | POA: Diagnosis not present

## 2017-12-24 DIAGNOSIS — Z1389 Encounter for screening for other disorder: Secondary | ICD-10-CM | POA: Diagnosis not present

## 2017-12-24 DIAGNOSIS — F419 Anxiety disorder, unspecified: Secondary | ICD-10-CM | POA: Diagnosis not present

## 2018-02-15 DIAGNOSIS — J019 Acute sinusitis, unspecified: Secondary | ICD-10-CM | POA: Diagnosis not present

## 2018-02-15 DIAGNOSIS — Z6822 Body mass index (BMI) 22.0-22.9, adult: Secondary | ICD-10-CM | POA: Diagnosis not present

## 2018-02-15 DIAGNOSIS — Z1389 Encounter for screening for other disorder: Secondary | ICD-10-CM | POA: Diagnosis not present

## 2018-02-15 DIAGNOSIS — R42 Dizziness and giddiness: Secondary | ICD-10-CM | POA: Diagnosis not present

## 2018-02-21 ENCOUNTER — Other Ambulatory Visit (HOSPITAL_COMMUNITY): Payer: Self-pay | Admitting: Family Medicine

## 2018-02-21 ENCOUNTER — Ambulatory Visit (HOSPITAL_COMMUNITY)
Admission: RE | Admit: 2018-02-21 | Discharge: 2018-02-21 | Disposition: A | Discharge status: Home / Self Care | Payer: BLUE CROSS/BLUE SHIELD | Source: Ambulatory Visit | Attending: Family Medicine | Admitting: Family Medicine

## 2018-02-21 DIAGNOSIS — R51 Headache: Secondary | ICD-10-CM | POA: Insufficient documentation

## 2018-02-21 DIAGNOSIS — R519 Headache, unspecified: Secondary | ICD-10-CM

## 2018-02-21 DIAGNOSIS — H5700 Unspecified anomaly of pupillary function: Secondary | ICD-10-CM | POA: Diagnosis not present

## 2018-02-21 DIAGNOSIS — Z6824 Body mass index (BMI) 24.0-24.9, adult: Secondary | ICD-10-CM | POA: Diagnosis not present

## 2018-02-21 DIAGNOSIS — H5702 Anisocoria: Secondary | ICD-10-CM | POA: Diagnosis not present

## 2018-03-24 ENCOUNTER — Encounter: Payer: Self-pay | Admitting: Adult Health

## 2018-03-24 ENCOUNTER — Ambulatory Visit (INDEPENDENT_AMBULATORY_CARE_PROVIDER_SITE_OTHER): Payer: Medicaid Other | Admitting: Adult Health

## 2018-03-24 VITALS — BP 119/70 | HR 93 | Ht 65.0 in | Wt 139.0 lb

## 2018-03-24 DIAGNOSIS — Z01419 Encounter for gynecological examination (general) (routine) without abnormal findings: Secondary | ICD-10-CM

## 2018-03-24 DIAGNOSIS — Z113 Encounter for screening for infections with a predominantly sexual mode of transmission: Secondary | ICD-10-CM | POA: Insufficient documentation

## 2018-03-24 DIAGNOSIS — Z3041 Encounter for surveillance of contraceptive pills: Secondary | ICD-10-CM | POA: Insufficient documentation

## 2018-03-24 DIAGNOSIS — Z3009 Encounter for other general counseling and advice on contraception: Secondary | ICD-10-CM

## 2018-03-24 MED ORDER — NORGESTIMATE-ETH ESTRADIOL 0.25-35 MG-MCG PO TABS: 1.0000 | ORAL_TABLET | Freq: Every day | ORAL | 12 refills | Status: DC

## 2018-03-24 NOTE — Progress Notes (Signed)
Patient ID: GERTRUD BELVIN, female   DOB: 1985-06-05, 33 y.o.   MRN: [id number] History of Present Illness: Ladan is a 33 year old white female, singel, G1P1 in for well woman gyn exam, she had a normal pap with negative HPV 12/28.18.She has Family planning Medicaid. PCP is [place].    Current Medications, Allergies, Past Medical History, Past Surgical History, Family History and Social History were reviewed in [facility] record.     Review of Systems: Patient denies any headaches, hearing loss, fatigue, blurred vision, shortness of breath, chest pain, abdominal pain, problems with bowel movements, urination, or intercourse. No joint pain or mood swings. LLQ  Has discomfort at times    Physical Exam:BP 119/70 (BP Location: Left Arm, Patient Position: Sitting, Cuff Size: Normal)   Pulse 93   Ht 5\' 5"  (1.651 m)   Wt 139 lb (63 kg)   LMP 02/21/2018 (Approximate)   BMI 23.13 kg/m  General:  Well developed, well nourished, no acute distress Skin:  Warm and dry Neck:  Midline trachea, normal thyroid, good ROM, no lymphadenopathy Lungs; Clear to auscultation bilaterally Breast:  No dominant palpable mass, retraction, or nipple discharge Cardiovascular: Regular rate and rhythm Abdomen:  Soft, non tender, no hepatosplenomegaly Pelvic:  External genitalia is normal in appearance, no lesions.  The vagina is normal in appearance. Urethra has no lesions or masses. The cervix is bulbous and smooth, GC/CHL obtained. Uterus is felt to be normal size, shape, and contour.  No adnexal masses or tenderness noted.Bladder is non tender, no masses felt. Extremities/musculoskeletal:  No swelling or varicosities noted, no clubbing or cyanosis Psych:  No mood changes, alert and cooperative,seems happy PHQ 2 score 1.She takes lexapro. Fall risk is low. Examination chaperoned by Marchelle Folks Rash LPN. Pt aware can't take COC after 37 since smokes, will switch to POP. She will check with  her Mom to see how old she was when got breast cancer, and will start her mammograms 10 years before that or at 54 which ever comes first.    Impression: 1. Encounter for well woman exam with routine gynecological exam   2. Family planning   3. Encounter for surveillance of contraceptive pills   4. Screening examination for STD (sexually transmitted disease)       Plan: GC/CHL sent Check HIV and RPR Physical and pap in 1 year Meds ordered this encounter  Medications  . norgestimate-ethinyl estradiol (SPRINTEC 28) 0.25-35 MG-MCG tablet    Sig: Take 1 tablet by mouth daily.    Dispense:  28 tablet    Refill:  12    Order Specific Question:   Supervising Provider    Answer:   Duane Lope H [[id number]]

## 2018-03-25 LAB — RPR: RPR Ser Ql: NONREACTIVE

## 2018-03-25 LAB — HIV ANTIBODY (ROUTINE TESTING W REFLEX): HIV SCREEN 4TH GENERATION: NONREACTIVE

## 2018-03-29 LAB — GC/CHLAMYDIA PROBE AMP
Chlamydia trachomatis, NAA: NEGATIVE
NEISSERIA GONORRHOEAE BY PCR: NEGATIVE

## 2018-04-12 DIAGNOSIS — Z1389 Encounter for screening for other disorder: Secondary | ICD-10-CM | POA: Diagnosis not present

## 2018-04-12 DIAGNOSIS — Z6823 Body mass index (BMI) 23.0-23.9, adult: Secondary | ICD-10-CM | POA: Diagnosis not present

## 2018-04-12 DIAGNOSIS — F341 Dysthymic disorder: Secondary | ICD-10-CM | POA: Diagnosis not present

## 2018-09-02 DIAGNOSIS — F419 Anxiety disorder, unspecified: Secondary | ICD-10-CM | POA: Diagnosis not present

## 2018-09-02 DIAGNOSIS — Z719 Counseling, unspecified: Secondary | ICD-10-CM | POA: Diagnosis not present

## 2018-09-02 DIAGNOSIS — Z1389 Encounter for screening for other disorder: Secondary | ICD-10-CM | POA: Diagnosis not present

## 2018-09-02 DIAGNOSIS — Z6823 Body mass index (BMI) 23.0-23.9, adult: Secondary | ICD-10-CM | POA: Diagnosis not present

## 2018-12-23 DIAGNOSIS — Z6823 Body mass index (BMI) 23.0-23.9, adult: Secondary | ICD-10-CM | POA: Diagnosis not present

## 2018-12-23 DIAGNOSIS — F419 Anxiety disorder, unspecified: Secondary | ICD-10-CM | POA: Diagnosis not present

## 2018-12-23 DIAGNOSIS — H6690 Otitis media, unspecified, unspecified ear: Secondary | ICD-10-CM | POA: Diagnosis not present

## 2019-02-28 ENCOUNTER — Other Ambulatory Visit: Payer: Self-pay | Admitting: Adult Health

## 2019-03-27 DIAGNOSIS — F419 Anxiety disorder, unspecified: Secondary | ICD-10-CM | POA: Diagnosis not present

## 2019-03-27 DIAGNOSIS — Z6823 Body mass index (BMI) 23.0-23.9, adult: Secondary | ICD-10-CM | POA: Diagnosis not present

## 2019-05-12 ENCOUNTER — Other Ambulatory Visit (HOSPITAL_COMMUNITY)
Admission: RE | Admit: 2019-05-12 | Discharge: 2019-05-12 | Disposition: A | Discharge status: Home / Self Care | Payer: BC Managed Care – PPO | Source: Ambulatory Visit | Attending: Adult Health | Admitting: Adult Health

## 2019-05-12 ENCOUNTER — Other Ambulatory Visit: Payer: Self-pay

## 2019-05-12 ENCOUNTER — Encounter: Payer: Self-pay | Admitting: Adult Health

## 2019-05-12 ENCOUNTER — Ambulatory Visit (INDEPENDENT_AMBULATORY_CARE_PROVIDER_SITE_OTHER): Payer: BC Managed Care – PPO | Admitting: Adult Health

## 2019-05-12 VITALS — BP 119/76 | HR 76 | Ht 64.0 in | Wt 128.0 lb

## 2019-05-12 DIAGNOSIS — Z01419 Encounter for gynecological examination (general) (routine) without abnormal findings: Secondary | ICD-10-CM | POA: Diagnosis not present

## 2019-05-12 DIAGNOSIS — Z3009 Encounter for other general counseling and advice on contraception: Secondary | ICD-10-CM

## 2019-05-12 DIAGNOSIS — Z113 Encounter for screening for infections with a predominantly sexual mode of transmission: Secondary | ICD-10-CM

## 2019-05-12 DIAGNOSIS — Z3041 Encounter for surveillance of contraceptive pills: Secondary | ICD-10-CM

## 2019-05-12 LAB — POCT URINE PREGNANCY: Preg Test, Ur: NEGATIVE

## 2019-05-12 MED ORDER — NORGESTIMATE-ETH ESTRADIOL 0.25-35 MG-MCG PO TABS: 1.0000 | ORAL_TABLET | Freq: Every day | ORAL | 4 refills | Status: DC

## 2019-05-12 NOTE — Progress Notes (Signed)
Patient ID: Tracy Choi, female   DOB: March 19, 1985, 34 y.o.   MRN: [id number] History of Present Illness: Tracy Choi is a 34 year old white female, with SO, G1P1 in for a well woman gyn exam and pap. She is still working at [employer]. PCP is [place].   Current Medications, Allergies, Past Medical History, Past Surgical History, Family History and Social History were reviewed in [facility] record.     Review of Systems: Patient denies any headaches, hearing loss, fatigue, blurred vision, shortness of breath, chest pain, abdominal pain, problems with bowel movements, urination, or intercourse. No joint pain or mood swings. She has asthma and does not wear a mask, just face shield, but it does not cover mouth, she does have doctors note.  She is getting patches to stop smoking.    Physical Exam:BP 119/76 (BP Location: Left Arm, Patient Position: Sitting, Cuff Size: Normal)   Pulse 76   Ht 5\' 4"  (1.626 m)   Wt 128 lb (58.1 kg)   LMP 04/21/2019 (Exact Date)   BMI 21.97 kg/m UPT is negative. Has face shield on, no mask.  General:  Well developed, well nourished, no acute distress Skin:  Warm and dry,has several tatoos Neck:  Midline trachea, normal thyroid, good ROM, no lymphadenopathy Lungs; Clear to auscultation bilaterally Breast:  No dominant palpable mass, retraction, or nipple discharge Cardiovascular: Regular rate and rhythm Abdomen:  Soft, non tender, no hepatosplenomegaly Pelvic:  External genitalia is normal in appearance, no lesions.  The vagina is normal in appearance. Urethra has no lesions or masses. The cervix is bulbous.Pap with GC/CHL and high risk HPV 16/18 genotyoing performed.  Uterus is felt to be normal size, shape, and contour.  No adnexal masses or tenderness noted.Bladder is non tender, no masses felt Extremities/musculoskeletal:  No swelling or varicosities noted, no clubbing or cyanosis Psych:  No mood changes, alert and cooperative,seems  happy Fall risk is low PHQ 2 score is 0. Examination chaperoned by 08-05-1970 LPN  Impression and Plan: 1. Encounter for gynecological examination with Papanicolaou smear of cervix Pap sent Physical in 1 year Pap in 3 if normal   2. Family planning Check HIV and RPR  3. Screening examination for STD (sexually transmitted disease) Check HIV and RPR  4. Encounter for surveillance of contraceptive pills Meds ordered this encounter  Medications  . norgestimate-ethinyl estradiol (SPRINTEC 28) 0.25-35 MG-MCG tablet    Sig: Take 1 tablet by mouth daily.    Dispense:  84 tablet    Refill:  4    Order Specific Question:   Supervising Provider    Answer:   11-30-1998 [[id number]]  She is aware if still smoking at 37 will go to POP pill.

## 2019-05-15 ENCOUNTER — Telehealth: Payer: Self-pay | Admitting: *Deleted

## 2019-05-15 DIAGNOSIS — Z3009 Encounter for other general counseling and advice on contraception: Secondary | ICD-10-CM | POA: Diagnosis not present

## 2019-05-15 DIAGNOSIS — Z113 Encounter for screening for infections with a predominantly sexual mode of transmission: Secondary | ICD-10-CM | POA: Diagnosis not present

## 2019-05-15 NOTE — Telephone Encounter (Signed)
Called for pap results.

## 2019-05-15 NOTE — Telephone Encounter (Signed)
Pt called wanting pap results but we haven't received her results yet. Pt was advised we should get results this week. Pt voiced understanding. JSY

## 2019-05-16 ENCOUNTER — Telehealth: Payer: Self-pay | Admitting: Adult Health

## 2019-05-16 LAB — RPR: RPR Ser Ql: NONREACTIVE

## 2019-05-16 LAB — CYTOLOGY - PAP
Chlamydia: NEGATIVE
Comment: NEGATIVE
Comment: NEGATIVE
Comment: NORMAL
Diagnosis: NEGATIVE
High risk HPV: NEGATIVE
Neisseria Gonorrhea: NEGATIVE

## 2019-05-16 LAB — HIV ANTIBODY (ROUTINE TESTING W REFLEX): HIV Screen 4th Generation wRfx: NONREACTIVE

## 2019-05-16 MED ORDER — METRONIDAZOLE 500 MG PO TABS: 500.0000 mg | ORAL_TABLET | Freq: Two times a day (BID) | ORAL | 0 refills | Status: DC

## 2019-05-16 NOTE — Telephone Encounter (Signed)
Pt aware that pap negative for malignancy and HPV, GC/CHL, but +BV will rx flagyl

## 2019-05-16 NOTE — Telephone Encounter (Signed)
Left message that HIV and RPR both negative, pap not back

## 2019-06-26 DIAGNOSIS — J069 Acute upper respiratory infection, unspecified: Secondary | ICD-10-CM | POA: Diagnosis not present

## 2019-06-26 DIAGNOSIS — F17200 Nicotine dependence, unspecified, uncomplicated: Secondary | ICD-10-CM | POA: Diagnosis not present

## 2019-06-26 DIAGNOSIS — F419 Anxiety disorder, unspecified: Secondary | ICD-10-CM | POA: Diagnosis not present

## 2019-06-26 DIAGNOSIS — Z6821 Body mass index (BMI) 21.0-21.9, adult: Secondary | ICD-10-CM | POA: Diagnosis not present

## 2019-06-26 DIAGNOSIS — Z719 Counseling, unspecified: Secondary | ICD-10-CM | POA: Diagnosis not present

## 2019-07-05 ENCOUNTER — Telehealth: Payer: Self-pay | Admitting: Adult Health

## 2019-07-05 NOTE — Telephone Encounter (Signed)
Patient called, stated that she has been itching really bad down there and this morning she had blood and it's creamy looking.  She is panicking and wanted to know what she needs to do.  [facility]  [phone]

## 2019-07-05 NOTE — Telephone Encounter (Signed)
Pt has vaginal itching and discharge. Sounds like a yeast infection. Advised to try Monistat. If that doesn't help, call office for possible Diflucan. Pt voiced understanding. JSY

## 2019-08-07 DIAGNOSIS — M9903 Segmental and somatic dysfunction of lumbar region: Secondary | ICD-10-CM | POA: Diagnosis not present

## 2019-08-07 DIAGNOSIS — M546 Pain in thoracic spine: Secondary | ICD-10-CM | POA: Diagnosis not present

## 2019-08-07 DIAGNOSIS — M9902 Segmental and somatic dysfunction of thoracic region: Secondary | ICD-10-CM | POA: Diagnosis not present

## 2019-08-07 DIAGNOSIS — M545 Low back pain: Secondary | ICD-10-CM | POA: Diagnosis not present

## 2019-09-25 ENCOUNTER — Ambulatory Visit (INDEPENDENT_AMBULATORY_CARE_PROVIDER_SITE_OTHER): Payer: BC Managed Care – PPO | Admitting: Women's Health

## 2019-09-25 ENCOUNTER — Encounter: Payer: Self-pay | Admitting: *Deleted

## 2019-09-25 ENCOUNTER — Telehealth: Payer: Self-pay | Admitting: *Deleted

## 2019-09-25 ENCOUNTER — Other Ambulatory Visit: Payer: Self-pay | Admitting: Women's Health

## 2019-09-25 ENCOUNTER — Telehealth: Payer: Self-pay | Admitting: Women's Health

## 2019-09-25 ENCOUNTER — Encounter: Payer: Self-pay | Admitting: Obstetrics & Gynecology

## 2019-09-25 VITALS — BP 109/67 | HR 64 | Ht 64.0 in | Wt 135.0 lb

## 2019-09-25 DIAGNOSIS — Z3201 Encounter for pregnancy test, result positive: Secondary | ICD-10-CM

## 2019-09-25 LAB — POCT URINE PREGNANCY: Preg Test, Ur: POSITIVE — AB

## 2019-09-25 MED ORDER — PRENATAL PLUS 27-1 MG PO TABS: 1.0000 | ORAL_TABLET | Freq: Every day | ORAL | 11 refills | Status: DC

## 2019-09-25 MED ORDER — BONJESTA 20-20 MG PO TBCR: 1.0000 | EXTENDED_RELEASE_TABLET | Freq: Every day | ORAL | 8 refills | Status: DC

## 2019-09-25 MED ORDER — CITRANATAL ASSURE 35-1 & 300 MG PO MISC: ORAL | 11 refills | Status: DC

## 2019-09-25 NOTE — Telephone Encounter (Signed)
-----   Message from Cheral Marker, [place] sent at 09/25/2019  2:21 PM EDT ----- Will you let her know I sent pnv to her pharmacy, [facility] [place], they will contact her to ship to her. Nicotine patches are ok. If smoking >=1ppd can do 21mg , if 1/2ppd 14mg , if <1/2ppd 7mg . Thanks

## 2019-09-25 NOTE — Progress Notes (Signed)
   NURSE VISIT- PREGNANCY CONFIRMATION   SUBJECTIVE:  Tracy Choi is a 34 y.o. G2P1001 female at Unknown by uncertain LMP of No LMP recorded. Patient is pregnant. Here for pregnancy confirmation.  Home pregnancy test: positive x 2  She reports nausea.  She is not taking prenatal vitamins.    OBJECTIVE:  BP 109/67 (BP Location: Right Arm, Patient Position: Sitting, Cuff Size: Normal)   Pulse 64   Ht 5\' 4"  (1.626 m)   Wt 135 lb (61.2 kg)   BMI 23.17 kg/m   Appears well, in no apparent distress OB History  Gravida Para Term Preterm AB Living  2 1 1  0 0 1  SAB TAB Ectopic Multiple Live Births  0 0 0 0 1    # Outcome Date GA Lbr Len/2nd Weight Sex Delivery Anes PTL Lv  2 Current           1 Term 08/27/11 [redacted]w[redacted]d 09:01 / 00:27 7 lb 5.8 oz (3.34 kg) M Vag-Spont EPI  LIV     Birth Comments: molding, but within normal limits    Results for orders placed or performed in visit on 09/25/19 (from the past 24 hour(s))  POCT urine pregnancy   Collection Time: 09/25/19 11:11 AM  Result Value Ref Range   Preg Test, Ur Positive (A) Negative    ASSESSMENT: Positive pregnancy test, Unknown by LMP    PLAN: Schedule for dating ultrasound in 3 weeks Prenatal vitamins: note routed to [facility] to send prescription   Nausea medicines: requested-note routed to [facility] to send prescription   OB packet given: Yes  11/26/19  09/25/2019 11:23 AM   I spoke with and examined patient and agree with resident/PA-S/MS/SNM's note and plan of care. Rx pnv. [facility] to [facility]. OK to do nicotine patches.  11/26/2019, CNM, [facility] 09/25/2019 11:38 AM

## 2019-09-25 NOTE — Telephone Encounter (Signed)
[facility] [address] [facility] [place].

## 2019-09-25 NOTE — Telephone Encounter (Signed)
Pt advised prenatal vit was sent to pharmacy. [facility]ausea [facility] to [facility] in [place]. They should contact pt to ship. Nicotine patches ok. Given how much to take depending on how much she smokes. Pt voiced understanding. JSY

## 2019-10-13 ENCOUNTER — Other Ambulatory Visit: Payer: Self-pay | Admitting: Obstetrics & Gynecology

## 2019-10-13 DIAGNOSIS — O3680X0 Pregnancy with inconclusive fetal viability, not applicable or unspecified: Secondary | ICD-10-CM

## 2019-10-16 ENCOUNTER — Ambulatory Visit (INDEPENDENT_AMBULATORY_CARE_PROVIDER_SITE_OTHER): Payer: BC Managed Care – PPO

## 2019-10-16 DIAGNOSIS — O3680X0 Pregnancy with inconclusive fetal viability, not applicable or unspecified: Secondary | ICD-10-CM | POA: Diagnosis not present

## 2019-10-16 NOTE — Progress Notes (Signed)
[place] 9+6 wks,single IUP,CRL 30.40,normal ovaries,fhr 169 bpm

## 2019-11-07 ENCOUNTER — Telehealth: Payer: Self-pay | Admitting: Women's Health

## 2019-11-07 NOTE — Telephone Encounter (Signed)
Pt states she has tried all of the recommendations for leg cramps. She asked about calcium pills and potassium pills(she can't eat bananas). Thanks!! JSY

## 2019-11-07 NOTE — Telephone Encounter (Signed)
Left message @ 2:11 pm. JSY

## 2019-11-07 NOTE — Telephone Encounter (Signed)
Patient called stating that she would like a nurse to give her a call back, pt states that she thinks she might need a higher dose of her prenatal pills because she is having a hard time sleeping and is experiencing leg cramp. Please contact pt

## 2019-11-07 NOTE — Telephone Encounter (Signed)
Pt advised to try Vit B1 and Vit B6 daily x 2 weeks. Pt voiced understanding. JSY

## 2019-11-07 NOTE — Telephone Encounter (Addendum)
Pt is having leg cramps at night. Has tried remedies at home and nothing is helping. Please advise. Thanks!! JSY

## 2019-11-07 NOTE — Telephone Encounter (Signed)
Patient was returning a call back.

## 2019-11-10 ENCOUNTER — Other Ambulatory Visit: Payer: Self-pay | Admitting: Obstetrics and Gynecology

## 2019-11-10 DIAGNOSIS — Z3682 Encounter for antenatal screening for nuchal translucency: Secondary | ICD-10-CM

## 2019-11-13 ENCOUNTER — Ambulatory Visit (INDEPENDENT_AMBULATORY_CARE_PROVIDER_SITE_OTHER): Payer: BC Managed Care – PPO | Admitting: Women's Health

## 2019-11-13 ENCOUNTER — Ambulatory Visit: Payer: BC Managed Care – PPO | Admitting: *Deleted

## 2019-11-13 ENCOUNTER — Encounter: Payer: Self-pay | Admitting: Women's Health

## 2019-11-13 ENCOUNTER — Ambulatory Visit (INDEPENDENT_AMBULATORY_CARE_PROVIDER_SITE_OTHER): Payer: BC Managed Care – PPO

## 2019-11-13 VITALS — BP 110/67 | HR 68 | Wt 134.0 lb

## 2019-11-13 DIAGNOSIS — Z3490 Encounter for supervision of normal pregnancy, unspecified, unspecified trimester: Secondary | ICD-10-CM | POA: Insufficient documentation

## 2019-11-13 DIAGNOSIS — Z3481 Encounter for supervision of other normal pregnancy, first trimester: Secondary | ICD-10-CM

## 2019-11-13 DIAGNOSIS — Z331 Pregnant state, incidental: Secondary | ICD-10-CM

## 2019-11-13 DIAGNOSIS — F17200 Nicotine dependence, unspecified, uncomplicated: Secondary | ICD-10-CM

## 2019-11-13 DIAGNOSIS — Z31430 Encounter of female for testing for genetic disease carrier status for procreative management: Secondary | ICD-10-CM | POA: Diagnosis not present

## 2019-11-13 DIAGNOSIS — Z1379 Encounter for other screening for genetic and chromosomal anomalies: Secondary | ICD-10-CM | POA: Diagnosis not present

## 2019-11-13 DIAGNOSIS — Z363 Encounter for antenatal screening for malformations: Secondary | ICD-10-CM

## 2019-11-13 DIAGNOSIS — Z3682 Encounter for antenatal screening for nuchal translucency: Secondary | ICD-10-CM

## 2019-11-13 DIAGNOSIS — Z3A13 13 weeks gestation of pregnancy: Secondary | ICD-10-CM

## 2019-11-13 DIAGNOSIS — Z1389 Encounter for screening for other disorder: Secondary | ICD-10-CM

## 2019-11-13 LAB — POCT URINALYSIS DIPSTICK OB
Blood, UA: NEGATIVE
Glucose, UA: NEGATIVE
Ketones, UA: NEGATIVE
Leukocytes, UA: NEGATIVE
Nitrite, UA: NEGATIVE
POC,PROTEIN,UA: NEGATIVE

## 2019-11-13 MED ORDER — BLOOD PRESSURE CUFF MISC: 1.0000 | Freq: Every day | 0 refills | Status: DC

## 2019-11-13 NOTE — Progress Notes (Signed)
INITIAL OBSTETRICAL VISIT Patient name: Tracy Choi MRN [id number]  Date of birth: 1985/07/23 Chief Complaint:   Initial Prenatal Visit  History of Present Illness:   Tracy Choi is a 34 y.o. G20P1001 Caucasian female at [redacted]w[redacted]d by [place] at 9 weeks with an Estimated Date of Delivery: 05/14/20 being seen today for her initial obstetrical visit.   Her obstetrical history is significant for term uncomplicated SVB x 1.   Today she reports no complaints.  Smoker: 1/2ppd down from 1ppd, wants to try to quit on her own, has patches at home Depression screen [facility] 2/9 11/13/2019 05/12/2019 03/24/2018 03/12/2017 03/11/2016  Decreased Interest 0 0 0 0 0  Down, Depressed, Hopeless 0 0 1 0 0  PHQ - 2 Score 0 0 1 0 0  Altered sleeping 3 - - - -  Tired, decreased energy 3 - - - -  Change in appetite 0 - - - -  Feeling bad or failure about yourself  0 - - - -  Trouble concentrating 0 - - - -  Moving slowly or fidgety/restless 0 - - - -  Suicidal thoughts 0 - - - -  PHQ-9 Score 6 - - - -  Difficult doing work/chores Not difficult at all - - - -    No LMP recorded (lmp unknown). Patient is pregnant. Last pap 05/12/19. Results were: normal Review of Systems:   Pertinent items are noted in HPI Denies cramping/contractions, leakage of fluid, vaginal bleeding, abnormal vaginal discharge w/ itching/odor/irritation, headaches, visual changes, shortness of breath, chest pain, abdominal pain, severe nausea/vomiting, or problems with urination or bowel movements unless otherwise stated above.  Pertinent History Reviewed:  Reviewed past medical,surgical, social, obstetrical and family history.  Reviewed problem list, medications and allergies. OB History  Gravida Para Term Preterm AB Living  2 1 1  0 0 1  SAB TAB Ectopic Multiple Live Births  0 0 0 0 1    # Outcome Date GA Lbr Len/2nd Weight Sex Delivery Anes PTL Lv  2 Current           1 Term 08/27/11 [redacted]w[redacted]d 09:01 / 00:27 7 lb 5.8 oz (3.34 kg) M Vag-Spont  EPI  LIV     Birth Comments: molding, but within normal limits   Physical Assessment:   Vitals:   11/13/19 0928  BP: 110/67  Pulse: 68  Weight: 134 lb (60.8 kg)  Body mass index is 23 kg/m.       Physical Examination:  General appearance - well appearing, and in no distress  Mental status - alert, oriented to person, place, and time  Psych:  She has a normal mood and affect  Skin - warm and dry, normal color, no suspicious lesions noted  Chest - effort normal, all lung fields clear to auscultation bilaterally  Heart - normal rate and regular rhythm  Abdomen - soft, nontender  Extremities:  No swelling or varicosities noted  Thin prep pap is not done  TODAY'S NT 11/15/19 13+6 wks,measurements c/w dates, CRL 82.20 mm,NB present,NT 2.2 mm,fhr 150 bpm,normal ovaries,anterior placenta with mult venous lakes  Results for orders placed or performed in visit on 11/13/19 (from the past 24 hour(s))  POC Urinalysis Dipstick OB   Collection Time: 11/13/19  9:44 AM  Result Value Ref Range   Color, UA     Clarity, UA     Glucose, UA Negative Negative   Bilirubin, UA     Ketones, UA neg  Spec Grav, UA     Blood, UA neg    pH, UA     POC,PROTEIN,UA Negative Negative, Trace, Small (1+), Moderate (2+), Large (3+), 4+   Urobilinogen, UA     Nitrite, UA neg    Leukocytes, UA Negative Negative   Appearance     Odor      Assessment & Plan:  1) Low-Risk Pregnancy G2P1001 at [redacted]w[redacted]d with an Estimated Date of Delivery: 05/14/20   2) Initial OB visit  3) Smoker> Smokes 1/2pp/day down from 1ppd, counseled x 3-19mins, advised cessation, discussed risks to fetus while pregnant, to infant pp, and to herself. Offered QuitlineNC, declined. Has patches at home she wants to try. Would use 14mg  patch for now. Do not smoke w/ patch on!  Meds:  Meds ordered this encounter  Medications  . Blood Pressure Monitoring (BLOOD PRESSURE CUFF) MISC    Sig: 1 Device by Does not apply route daily.    Dispense:  1  each    Refill:  0    Initial labs obtained Continue prenatal vitamins Reviewed n/v relief measures and warning s/s to report Reviewed recommended weight gain based on pre-gravid BMI Encouraged well-balanced diet Genetic & carrier screening discussed: requests Panorama, NT/IT and Horizon 14  Ultrasound discussed; fetal survey: requested CCNC completed> form faxed if has or is planning to apply for medicaid The nature of for [employer] with multiple MDs and other Advanced Practice Providers was explained to patient; also emphasized that fellows, residents, and students are part of our team. Does not have home bp cuff. Has [employer], so would need to purchase her own. Check bp weekly, let [employer] know if >140/90.   Follow-up: Return in about 29 days (around 12/12/2019) for LROB, 2nd IT, 12/14/2019, in person, CNM.   Orders Placed This Encounter  Procedures  . GC/Chlamydia Probe Amp  . Urine Culture  . ZO:XWRUEAV OB Comp + 14 Wk  . CBC/D/Plt+RPR+Rh+ABO+Rub Ab...  . Pain Management Screening Profile (10S)  . Integrated 1  . POC Urinalysis Dipstick OB    [place] CNM, [facility] 11/13/2019 10:22 AM

## 2019-11-13 NOTE — Patient Instructions (Signed)
Tracy Choi, I greatly value your feedback.  If you receive a survey following your visit with [place] today, we appreciate you taking the time to fill it out.  Thanks, Joellyn Haff, CNM, WHNP-BC  [facility] ([address] [place], [place] [address]) Entrance C, located off of [address] valet parking  Go to [email or web address] to register for FREE online childbirth classes  [place] Pediatricians/Family Doctors:  [facility] [phone]            [facility] [phone]                 [facility] [phone] (usually not accepting new patients unless you have family there already, you are always welcome to call and ask)       [facility] Department [phone]       [facility] Pediatricians/Family Doctors:   [facility]: [phone]  [facility]: [phone]  [facility]: [phone]  [facility] Doctors:   [facility]: [phone]   [facility]: [phone]  [facility] Doctors:  [facility]: [phone]    Home Blood Pressure Monitoring for Patients   Your provider has recommended that you check your blood pressure (BP) at least once a week at home. If you do not have a blood pressure cuff at home, one will be provided for you. Contact your provider if you have not received your monitor within 1 week.   Helpful Tips for Accurate Home Blood Pressure Checks  . Don't smoke, exercise, or drink caffeine 30 minutes before checking your BP . Use the restroom before checking your BP (a full bladder can raise your pressure) . Relax in a comfortable upright chair . Feet on the ground . Left arm resting comfortably on a flat surface at the level of your heart . Legs uncrossed . Back supported . Sit quietly and don't talk . Place the cuff on your bare arm . Adjust snuggly, so  that only two fingertips can fit between your skin and the top of the cuff . Check 2 readings separated by at least one minute . Keep a log of your BP readings . For a visual, please reference this diagram: http://ccnc.care/bpdiagram  Provider Name: [facility]     Phone: [phone]  Zone 1: ALL CLEAR  Continue to monitor your symptoms:  . BP reading is less than 140 (top number) or less than 90 (bottom number)  . No right upper stomach pain . No headaches or seeing spots . No feeling nauseated or throwing up . No swelling in face and hands  Zone 2: CAUTION Call your doctor's office for any of the following:  . BP reading is greater than 140 (top number) or greater than 90 (bottom number)  . Stomach pain under your ribs in the middle or right side . Headaches or seeing spots . Feeling nauseated or throwing up . Swelling in face and hands  Zone 3: EMERGENCY  Seek immediate medical care if you have any of the following:  . BP reading is greater than160 (top number) or greater than 110 (bottom number) . Severe headaches not improving with Tylenol . Serious difficulty catching your breath . Any worsening symptoms from Zone 2     Second Trimester of Pregnancy The second trimester is from week 14 through week 27 (months 4 through 6). The second trimester is often a time when you feel your  best. Your body has adjusted to being pregnant, and you begin to feel better physically. Usually, morning sickness has lessened or quit completely, you may have more energy, and you may have an increase in appetite. The second trimester is also a time when the fetus is growing rapidly. At the end of the sixth month, the fetus is about 9 inches long and weighs about 1 pounds. You will likely begin to feel the baby move (quickening) between 16 and 20 weeks of pregnancy. Body changes during your second trimester Your body continues to go through many changes during your second trimester. The  changes vary from woman to woman.  Your weight will continue to increase. You will notice your lower abdomen bulging out.  You may begin to get stretch marks on your hips, abdomen, and breasts.  You may develop headaches that can be relieved by medicines. The medicines should be approved by your health care provider.  You may urinate more often because the fetus is pressing on your bladder.  You may develop or continue to have heartburn as a result of your pregnancy.  You may develop constipation because certain hormones are causing the muscles that push waste through your intestines to slow down.  You may develop hemorrhoids or swollen, bulging veins (varicose veins).  You may have back pain. This is caused by: ? Weight gain. ? Pregnancy hormones that are relaxing the joints in your pelvis. ? A shift in weight and the muscles that support your balance.  Your breasts will continue to grow and they will continue to become tender.  Your gums may bleed and may be sensitive to brushing and flossing.  Dark spots or blotches (chloasma, mask of pregnancy) may develop on your face. This will likely fade after the baby is born.  A dark line from your belly button to the pubic area (linea nigra) may appear. This will likely fade after the baby is born.  You may have changes in your hair. These can include thickening of your hair, rapid growth, and changes in texture. Some women also have hair loss during or after pregnancy, or hair that feels dry or thin. Your hair will most likely return to normal after your baby is born.  What to expect at prenatal visits During a routine prenatal visit:  You will be weighed to make sure you and the fetus are growing normally.  Your blood pressure will be taken.  Your abdomen will be measured to track your baby's growth.  The fetal heartbeat will be listened to.  Any test results from the previous visit will be discussed.  Your health care  provider may ask you:  How you are feeling.  If you are feeling the baby move.  If you have had any abnormal symptoms, such as leaking fluid, bleeding, severe headaches, or abdominal cramping.  If you are using any tobacco products, including cigarettes, chewing tobacco, and electronic cigarettes.  If you have any questions.  Other tests that may be performed during your second trimester include:  Blood tests that check for: ? Low iron levels (anemia). ? High blood sugar that affects pregnant women (gestational diabetes) between 71 and 28 weeks. ? Rh antibodies. This is to check for a protein on red blood cells (Rh factor).  Urine tests to check for infections, diabetes, or protein in the urine.  An ultrasound to confirm the proper growth and development of the baby.  An amniocentesis to check for possible genetic problems.  Fetal  screens for spina bifida and Down syndrome.  HIV (human immunodeficiency virus) testing. Routine prenatal testing includes screening for HIV, unless you choose not to have this test.  Follow these instructions at home: Medicines  Follow your health care provider's instructions regarding medicine use. Specific medicines may be either safe or unsafe to take during pregnancy.  Take a prenatal vitamin that contains at least 600 micrograms (mcg) of folic acid.  If you develop constipation, try taking a stool softener if your health care provider approves. Eating and drinking  Eat a balanced diet that includes fresh fruits and vegetables, whole grains, good sources of protein such as meat, eggs, or tofu, and low-fat dairy. Your health care provider will help you determine the amount of weight gain that is right for you.  Avoid raw meat and uncooked cheese. These carry germs that can cause birth defects in the baby.  If you have low calcium intake from food, talk to your health care provider about whether you should take a daily calcium  supplement.  Limit foods that are high in fat and processed sugars, such as fried and sweet foods.  To prevent constipation: ? Drink enough fluid to keep your urine clear or pale yellow. ? Eat foods that are high in fiber, such as fresh fruits and vegetables, whole grains, and beans. Activity  Exercise only as directed by your health care provider. Most women can continue their usual exercise routine during pregnancy. Try to exercise for 30 minutes at least 5 days a week. Stop exercising if you experience uterine contractions.  Avoid heavy lifting, wear low heel shoes, and practice good posture.  A sexual relationship may be continued unless your health care provider directs you otherwise. Relieving pain and discomfort  Wear a good support bra to prevent discomfort from breast tenderness.  Take warm sitz baths to soothe any pain or discomfort caused by hemorrhoids. Use hemorrhoid cream if your health care provider approves.  Rest with your legs elevated if you have leg cramps or low back pain.  If you develop varicose veins, wear support hose. Elevate your feet for 15 minutes, 3-4 times a day. Limit salt in your diet. Prenatal Care  Write down your questions. Take them to your prenatal visits.  Keep all your prenatal visits as told by your health care provider. This is important. Safety  Wear your seat belt at all times when driving.  Make a list of emergency phone numbers, including numbers for family, friends, the hospital, and police and fire departments. General instructions  Ask your health care provider for a referral to a local prenatal education class. Begin classes no later than the beginning of month 6 of your pregnancy.  Ask for help if you have counseling or nutritional needs during pregnancy. Your health care provider can offer advice or refer you to specialists for help with various needs.  Do not use hot tubs, steam rooms, or saunas.  Do not douche or use  tampons or scented sanitary pads.  Do not cross your legs for long periods of time.  Avoid cat litter boxes and soil used by cats. These carry germs that can cause birth defects in the baby and possibly loss of the fetus by miscarriage or stillbirth.  Avoid all smoking, herbs, alcohol, and unprescribed drugs. Chemicals in these products can affect the formation and growth of the baby.  Do not use any products that contain nicotine or tobacco, such as cigarettes and e-cigarettes. If you need help  quitting, ask your health care provider.  Visit your dentist if you have not gone yet during your pregnancy. Use a soft toothbrush to brush your teeth and be gentle when you floss. Contact a health care provider if:  You have dizziness.  You have mild pelvic cramps, pelvic pressure, or nagging pain in the abdominal area.  You have persistent nausea, vomiting, or diarrhea.  You have a bad smelling vaginal discharge.  You have pain when you urinate. Get help right away if:  You have a fever.  You are leaking fluid from your vagina.  You have spotting or bleeding from your vagina.  You have severe abdominal cramping or pain.  You have rapid weight gain or weight loss.  You have shortness of breath with chest pain.  You notice sudden or extreme swelling of your face, hands, ankles, feet, or legs.  You have not felt your baby move in over an hour.  You have severe headaches that do not go away when you take medicine.  You have vision changes. Summary  The second trimester is from week 14 through week 27 (months 4 through 6). It is also a time when the fetus is growing rapidly.  Your body goes through many changes during pregnancy. The changes vary from woman to woman.  Avoid all smoking, herbs, alcohol, and unprescribed drugs. These chemicals affect the formation and growth your baby.  Do not use any tobacco products, such as cigarettes, chewing tobacco, and e-cigarettes. If you  need help quitting, ask your health care provider.  Contact your health care provider if you have any questions. Keep all prenatal visits as told by your health care provider. This is important. This information is not intended to replace advice given to you by your health care provider. Make sure you discuss any questions you have with your health care provider. Document Released: 02/24/2001 Document Revised: 08/08/2015 Document Reviewed: 05/03/2012 Elsevier Interactive Patient Education  2017 [employer]   

## 2019-11-13 NOTE — Progress Notes (Signed)
[place] 13+6 wks,measurements c/w dates, CRL 82.20 mm,NB present,NT 2.2 mm,fhr 150 bpm,normal ovaries,anterior placenta with mult venous lakes

## 2019-11-14 LAB — GC/CHLAMYDIA PROBE AMP
Chlamydia trachomatis, NAA: NEGATIVE
Neisseria Gonorrhoeae by PCR: NEGATIVE

## 2019-11-14 LAB — PMP SCREEN PROFILE (10S), URINE
Amphetamine Scrn, Ur: NEGATIVE ng/mL
BARBITURATE SCREEN URINE: NEGATIVE ng/mL
BENZODIAZEPINE SCREEN, URINE: NEGATIVE ng/mL
CANNABINOIDS UR QL SCN: NEGATIVE ng/mL
Cocaine (Metab) Scrn, Ur: NEGATIVE ng/mL
Creatinine(Crt), U: 21.7 mg/dL (ref 20.0–300.0)
Methadone Screen, Urine: NEGATIVE ng/mL
OXYCODONE+OXYMORPHONE UR QL SCN: NEGATIVE ng/mL
Opiate Scrn, Ur: NEGATIVE ng/mL
Ph of Urine: 5.8 (ref 4.5–8.9)
Phencyclidine Qn, Ur: NEGATIVE ng/mL
Propoxyphene Scrn, Ur: NEGATIVE ng/mL

## 2019-11-15 LAB — CBC/D/PLT+RPR+RH+ABO+RUB AB...
Antibody Screen: NEGATIVE
Basophils Absolute: 0.1 10*3/uL (ref 0.0–0.2)
Basos: 1 %
EOS (ABSOLUTE): 0.4 10*3/uL (ref 0.0–0.4)
Eos: 3 %
HCV Ab: <0.1 s/co ratio (ref 0.0–0.9)
HIV Screen 4th Generation wRfx: NONREACTIVE
Hematocrit: 39.6 % (ref 34.0–46.6)
Hemoglobin: 13.5 g/dL (ref 11.1–15.9)
Hepatitis B Surface Ag: NEGATIVE
Immature Grans (Abs): 0 10*3/uL (ref 0.0–0.1)
Immature Granulocytes: 0 %
Lymphocytes Absolute: 3.1 10*3/uL (ref 0.7–3.1)
Lymphs: 26 %
MCH: 30.1 pg (ref 26.6–33.0)
MCHC: 34.1 g/dL (ref 31.5–35.7)
MCV: 88 fL (ref 79–97)
Monocytes Absolute: 0.7 10*3/uL (ref 0.1–0.9)
Monocytes: 6 %
Neutrophils Absolute: 7.8 10*3/uL — ABNORMAL HIGH (ref 1.4–7.0)
Neutrophils: 64 %
Platelets: 270 10*3/uL (ref 150–450)
RBC: 4.49 x10E6/uL (ref 3.77–5.28)
RDW: 12.9 % (ref 11.7–15.4)
RPR Ser Ql: NONREACTIVE
Rh Factor: POSITIVE
Rubella Antibodies, IGG: 6.27 index (ref >0.99)
WBC: 12.1 10*3/uL — ABNORMAL HIGH (ref 3.4–10.8)

## 2019-11-15 LAB — INTEGRATED 1
Crown Rump Length: 82.2 mm
Gest. Age on Collection Date: 13.9 weeks
Maternal Age at EDD: 32.6 yr
Nuchal Translucency (NT): 2.2 mm
Number of Fetuses: 1
PAPP-A Value: 937.7 ng/mL
Weight: 134 [lb_av]

## 2019-11-15 LAB — URINE CULTURE

## 2019-11-15 LAB — HCV INTERPRETATION

## 2019-12-11 ENCOUNTER — Encounter: Payer: BC Managed Care – PPO | Admitting: Women's Health

## 2019-12-11 ENCOUNTER — Other Ambulatory Visit: Payer: BC Managed Care – PPO

## 2019-12-19 ENCOUNTER — Encounter: Payer: Self-pay | Admitting: *Deleted

## 2019-12-19 DIAGNOSIS — Z3482 Encounter for supervision of other normal pregnancy, second trimester: Secondary | ICD-10-CM

## 2019-12-25 ENCOUNTER — Encounter: Payer: Self-pay | Admitting: Obstetrics & Gynecology

## 2019-12-25 ENCOUNTER — Ambulatory Visit (INDEPENDENT_AMBULATORY_CARE_PROVIDER_SITE_OTHER): Payer: BC Managed Care – PPO | Admitting: Obstetrics & Gynecology

## 2019-12-25 ENCOUNTER — Ambulatory Visit (INDEPENDENT_AMBULATORY_CARE_PROVIDER_SITE_OTHER): Payer: BC Managed Care – PPO

## 2019-12-25 DIAGNOSIS — Z3A19 19 weeks gestation of pregnancy: Secondary | ICD-10-CM

## 2019-12-25 DIAGNOSIS — Z331 Pregnant state, incidental: Secondary | ICD-10-CM

## 2019-12-25 DIAGNOSIS — Z1389 Encounter for screening for other disorder: Secondary | ICD-10-CM

## 2019-12-25 DIAGNOSIS — Z363 Encounter for antenatal screening for malformations: Secondary | ICD-10-CM

## 2019-12-25 DIAGNOSIS — Z3482 Encounter for supervision of other normal pregnancy, second trimester: Secondary | ICD-10-CM

## 2019-12-25 DIAGNOSIS — Z3481 Encounter for supervision of other normal pregnancy, first trimester: Secondary | ICD-10-CM

## 2019-12-25 DIAGNOSIS — Z1379 Encounter for other screening for genetic and chromosomal anomalies: Secondary | ICD-10-CM

## 2019-12-25 LAB — POCT URINALYSIS DIPSTICK OB
Blood, UA: NEGATIVE
Glucose, UA: NEGATIVE
Ketones, UA: NEGATIVE
Leukocytes, UA: NEGATIVE
Nitrite, UA: NEGATIVE
POC,PROTEIN,UA: NEGATIVE

## 2019-12-25 NOTE — Progress Notes (Signed)
LOW-RISK PREGNANCY VISIT Patient name: Tracy Choi MRN [id number]  Date of birth: 1985-12-07 Chief Complaint:   Routine Prenatal Visit ([place] & 2nd IT today)  History of Present Illness:   Tracy Choi is a 34 y.o. G2P1001 female at [redacted]w[redacted]d with an Estimated Date of Delivery: 05/14/20 being seen today for ongoing management of a low-risk pregnancy.  Depression screen [facility] 2/9 11/13/2019 05/12/2019 03/24/2018 03/12/2017 03/11/2016  Decreased Interest 0 0 0 0 0  Down, Depressed, Hopeless 0 0 1 0 0  PHQ - 2 Score 0 0 1 0 0  Altered sleeping 3 - - - -  Tired, decreased energy 3 - - - -  Change in appetite 0 - - - -  Feeling bad or failure about yourself  0 - - - -  Trouble concentrating 0 - - - -  Moving slowly or fidgety/restless 0 - - - -  Suicidal thoughts 0 - - - -  PHQ-9 Score 6 - - - -  Difficult doing work/chores Not difficult at all - - - -    Today she reports varicose veins.  .  .   . denies leaking of fluid. Review of Systems:   Pertinent items are noted in HPI Denies abnormal vaginal discharge w/ itching/odor/irritation, headaches, visual changes, shortness of breath, chest pain, abdominal pain, severe nausea/vomiting, or problems with urination or bowel movements unless otherwise stated above. Pertinent History Reviewed:  Reviewed past medical,surgical, social, obstetrical and family history.  Reviewed problem list, medications and allergies. Physical Assessment:  There were no vitals filed for this visit.There is no height or weight on file to calculate BMI.        Physical Examination:   General appearance: Well appearing, and in no distress  Mental status: Alert, oriented to person, place, and time  Skin: Warm & dry  Cardiovascular: Normal heart rate noted  Respiratory: Normal respiratory effort, no distress  Abdomen: Soft, gravid, nontender  Pelvic: Cervical exam deferred         Extremities:    Fetal Status:          Chaperone:     Results for orders placed  or performed in visit on 12/25/19 (from the past 24 hour(s))  POC Urinalysis Dipstick OB   Collection Time: 12/25/19  2:36 PM  Result Value Ref Range   Color, UA     Clarity, UA     Glucose, UA Negative Negative   Bilirubin, UA     Ketones, UA neg    Spec Grav, UA     Blood, UA neg    pH, UA     POC,PROTEIN,UA Negative Negative, Trace, Small (1+), Moderate (2+), Large (3+), 4+   Urobilinogen, UA     Nitrite, UA neg    Leukocytes, UA Negative Negative   Appearance     Odor      Assessment & Plan:  1) Low-risk pregnancy G2P1001 at [redacted]w[redacted]d with an Estimated Date of Delivery: 05/14/20   2) ,    Meds: No orders of the defined types were placed in this encounter.  Labs/procedures today: sonogram normal  Plan:  Continue routine obstetrical care  Next visit: prefers in person    Reviewed: Preterm labor symptoms and general obstetric precautions including but not limited to vaginal bleeding, contractions, leaking of fluid and fetal movement were reviewed in detail with the patient.  All questions were answered. has home bp cuff. Rx faxed to  Check bp weekly, let us  know if >140/90.   Follow-up: Return in about 4 weeks (around 01/22/2020) for LROB.  Orders Placed This Encounter  Procedures  . INTEGRATED 2  . POC Urinalysis Dipstick OB   Lazaro Arms, MD 12/25/2019 3:00 PM

## 2019-12-25 NOTE — Progress Notes (Signed)
[place] 19+6 wks,breech,anterior placenta gr 0,normal ovaries,cx 3.8 cm,fhr 152 bpm,svp of fluid 4.7 cm,efw 341 g 67%,anatomy complete,no obvious abnormalities

## 2019-12-27 LAB — INTEGRATED 2
AFP MoM: 1.09
Alpha-Fetoprotein: 59.6 ng/mL
Crown Rump Length: 82.2 mm
DIA MoM: 3.05
DIA Value: 571.1 pg/mL
Estriol, Unconjugated: 1.36 ng/mL
Gest. Age on Collection Date: 13.9 weeks
Gestational Age: 19.9 weeks
Maternal Age at EDD: 32.6 yr
Nuchal Translucency (NT): 2.2 mm
Nuchal Translucency MoM: 1.12
Number of Fetuses: 1
PAPP-A MoM: 0.51
PAPP-A Value: 937.7 ng/mL
Test Results:: NEGATIVE
Weight: 134 [lb_av]
Weight: 143 [lb_av]
hCG MoM: 0.88
hCG Value: 20.4 IU/mL
uE3 MoM: 0.62

## 2020-01-22 ENCOUNTER — Encounter: Payer: Self-pay | Admitting: Obstetrics & Gynecology

## 2020-02-05 ENCOUNTER — Encounter: Payer: Self-pay | Admitting: Advanced Practice Midwife

## 2020-02-05 ENCOUNTER — Other Ambulatory Visit: Payer: Self-pay

## 2020-02-05 ENCOUNTER — Ambulatory Visit (INDEPENDENT_AMBULATORY_CARE_PROVIDER_SITE_OTHER): Payer: BC Managed Care – PPO | Admitting: Advanced Practice Midwife

## 2020-02-05 VITALS — BP 119/72 | HR 104 | Wt 151.5 lb

## 2020-02-05 DIAGNOSIS — Z1389 Encounter for screening for other disorder: Secondary | ICD-10-CM

## 2020-02-05 DIAGNOSIS — Z3482 Encounter for supervision of other normal pregnancy, second trimester: Secondary | ICD-10-CM

## 2020-02-05 DIAGNOSIS — Z3A25 25 weeks gestation of pregnancy: Secondary | ICD-10-CM

## 2020-02-05 DIAGNOSIS — Z331 Pregnant state, incidental: Secondary | ICD-10-CM

## 2020-02-05 LAB — POCT URINALYSIS DIPSTICK OB
Blood, UA: NEGATIVE
Glucose, UA: NEGATIVE
Ketones, UA: NEGATIVE
Nitrite, UA: NEGATIVE
POC,PROTEIN,UA: NEGATIVE

## 2020-02-05 MED ORDER — BLOOD PRESSURE MONITOR AUTOMAT DEVI: 0 refills | Status: DC

## 2020-02-05 MED ORDER — BUSPIRONE HCL 10 MG PO TABS: 10.0000 mg | ORAL_TABLET | Freq: Three times a day (TID) | ORAL | 3 refills | Status: DC

## 2020-02-05 NOTE — Patient Instructions (Signed)

## 2020-02-05 NOTE — Progress Notes (Signed)
LOW-RISK PREGNANCY VISIT Patient name: Tracy Choi MRN [id number]  Date of birth: 1985/05/15 Chief Complaint:   Routine Prenatal Visit  History of Present Illness:   Tracy Choi is a 34 y.o. G2P1001 female at [redacted]w[redacted]d with an Estimated Date of Delivery: 05/14/20 being seen today for ongoing management of a low-risk pregnancy.  Today she reports round ligament pain, Contractions: Not present. Vag. Bleeding: None.  Movement: Present. denies leaking of fluid. Review of Systems:   Pertinent items are noted in HPI Denies abnormal vaginal discharge w/ itching/odor/irritation, headaches, visual changes, shortness of breath, chest pain, abdominal pain, severe nausea/vomiting, or problems with urination or bowel movements unless otherwise stated above. Pertinent History Reviewed:  Reviewed past medical,surgical, social, obstetrical and family history.  Reviewed problem list, medications and allergies. Physical Assessment:   Vitals:   02/05/20 0855  BP: 119/72  Pulse: (!) 104  Weight: 151 lb 8 oz (68.7 kg)  Body mass index is 26 kg/m.        Physical Examination:   General appearance: Well appearing, and in no distress  Mental status: Alert, oriented to person, place, and time  Skin: Warm & dry  Cardiovascular: Normal heart rate noted  Respiratory: Normal respiratory effort, no distress  Abdomen: Soft, gravid, nontender  Pelvic: Cervical exam deferred         Extremities: Edema: Trace  Fetal Status: Fetal Heart Rate (bpm): 132 Fundal Height: 24 cm Movement: Present    Chaperone: n/a    Results for orders placed or performed in visit on 02/05/20 (from the past 24 hour(s))  POC Urinalysis Dipstick OB   Collection Time: 02/05/20  8:57 AM  Result Value Ref Range   Color, UA     Clarity, UA     Glucose, UA Negative Negative   Bilirubin, UA     Ketones, UA neg    Spec Grav, UA     Blood, UA neg    pH, UA     POC,PROTEIN,UA Negative Negative, Trace, Small (1+), Moderate (2+),  Large (3+), 4+   Urobilinogen, UA     Nitrite, UA neg    Leukocytes, UA Trace (A) Negative   Appearance     Odor      Assessment & Plan:  1) Low-risk pregnancy G2P1001 at [redacted]w[redacted]d with an Estimated Date of Delivery: 05/14/20     Meds:  Meds ordered this encounter  Medications  . Blood Pressure Monitoring (BLOOD PRESSURE MONITOR AUTOMAT) DEVI    Sig: Take BP at home daily.  Alert [place] if >140/90 more than once.    Dispense:  1 each    Refill:  0    Order Specific Question:   Supervising Provider    Answer:   Despina Hidden, LUTHER H [[id number]]  . DISCONTD: busPIRone (BUSPAR) 10 MG tablet    Sig: Take 1 tablet (10 mg total) by mouth 3 (three) times daily.    Dispense:  90 tablet    Refill:  3    Order Specific Question:   Supervising Provider    Answer:   Duane Lope H [[id number]]  (Buspar ordered in error) Labs/procedures today: none  Plan:  Continue routine obstetrical care  Next visit: prefers will be in person for PN2    Reviewed: Preterm labor symptoms and general obstetric precautions including but not limited to vaginal bleeding, contractions, leaking of fluid and fetal movement were reviewed in detail with the patient.  All questions were answered. Has home bp cuff. Check bp  weekly, let us know if >140/90.   Follow-up: Return in about 2 weeks (around 02/19/2020) for PN2/LROB.  Orders Placed This Encounter  Procedures  . POC Urinalysis Dipstick OB   Jacklyn Shell DNP, CNM 02/05/2020 9:56 AM

## 2020-02-22 ENCOUNTER — Ambulatory Visit (INDEPENDENT_AMBULATORY_CARE_PROVIDER_SITE_OTHER): Payer: BC Managed Care – PPO | Admitting: Women's Health

## 2020-02-22 ENCOUNTER — Ambulatory Visit (INDEPENDENT_AMBULATORY_CARE_PROVIDER_SITE_OTHER): Payer: Medicaid Other

## 2020-02-22 ENCOUNTER — Other Ambulatory Visit: Payer: Self-pay

## 2020-02-22 ENCOUNTER — Other Ambulatory Visit: Payer: BC Managed Care – PPO

## 2020-02-22 ENCOUNTER — Encounter: Payer: Self-pay | Admitting: Women's Health

## 2020-02-22 VITALS — BP 123/69 | HR 83 | Wt 155.8 lb

## 2020-02-22 DIAGNOSIS — Z3483 Encounter for supervision of other normal pregnancy, third trimester: Secondary | ICD-10-CM

## 2020-02-22 DIAGNOSIS — Z331 Pregnant state, incidental: Secondary | ICD-10-CM

## 2020-02-22 DIAGNOSIS — Z1389 Encounter for screening for other disorder: Secondary | ICD-10-CM

## 2020-02-22 DIAGNOSIS — Z3A28 28 weeks gestation of pregnancy: Secondary | ICD-10-CM | POA: Diagnosis not present

## 2020-02-22 DIAGNOSIS — O26843 Uterine size-date discrepancy, third trimester: Secondary | ICD-10-CM

## 2020-02-22 DIAGNOSIS — Z23 Encounter for immunization: Secondary | ICD-10-CM

## 2020-02-22 DIAGNOSIS — O2200 Varicose veins of lower extremity in pregnancy, unspecified trimester: Secondary | ICD-10-CM

## 2020-02-22 LAB — POCT URINALYSIS DIPSTICK OB
Blood, UA: NEGATIVE
Glucose, UA: NEGATIVE
Ketones, UA: NEGATIVE
Leukocytes, UA: NEGATIVE
Nitrite, UA: NEGATIVE
POC,PROTEIN,UA: NEGATIVE

## 2020-02-22 NOTE — Progress Notes (Signed)
[place] 28+2 wks,cephalic,anterior placenta gr 2,afi 16.7 cm,fhr 133 bpm,cx 3.5 cm,EFW 1296 g 59 %

## 2020-02-22 NOTE — Patient Instructions (Addendum)
Tracy Choi, I greatly value your feedback.  If you receive a survey following your visit with [place] today, we appreciate you taking the time to fill it out.  Thanks, Joellyn Haff, CNM, WHNP-BC   [facility] ([address] [place], [place] [address]) Entrance C, located off of [address] valet parking   Baby/low dose 81mg  aspirin daily  Go to .com to register for FREE online childbirth classes   Call the office [phone]) or go to [facility] if:  You begin to have strong, frequent contractions  Your water breaks.  Sometimes it is a big gush of fluid, sometimes it is just a trickle that keeps getting your panties wet or running down your legs  You have vaginal bleeding.  It is normal to have a small amount of spotting if your cervix was checked.   You don't feel your baby moving like normal.  If you don't, get you something to eat and drink and lay down and focus on feeling your baby move.  You should feel at least 10 movements in 2 hours.  If you don't, you should call the office or go to [facility].    Tdap Vaccine  It is recommended that you get the Tdap vaccine during the third trimester of EACH pregnancy to help protect your baby from getting pertussis (whooping cough)  27-36 weeks is the BEST time to do this so that you can pass the protection on to your baby. During pregnancy is better than after pregnancy, but if you are unable to get it during pregnancy it will be offered at the hospital.   You can get this vaccine with [facility], at the health department, your family doctor, or some local pharmacies  Everyone who will be around your baby should also be up-to-date on their vaccines before the baby comes. Adults (who are not pregnant) only need 1 dose of Tdap during adulthood.   [place] Pediatricians/Family Doctors:  [facility] [phone]            [facility] [phone]                  [facility] [phone] (usually not accepting new patients unless you have family there already, you are always welcome to call and ask)       [facility] Department [phone]       [facility] Pediatricians/Family Doctors:   [facility]: [phone]  [facility]: [phone]  [facility]: [phone]  [facility] Doctors:   [facility]: [phone]   [phone] Family Medicine: [phone]  [facility] Doctors:  [facility]: [phone]   Home Blood Pressure Monitoring for Patients   Your provider has recommended that you check your blood pressure (BP) at least once a week at home. If you do not have a blood pressure cuff at home, one will be provided for you. Contact your provider if you have not received your monitor within 1 week.   Helpful Tips for Accurate Home Blood Pressure Checks  . Don't smoke, exercise, or drink caffeine 30 minutes before checking your BP . Use the restroom before checking your BP (a full bladder can raise your pressure) . Relax in a comfortable upright chair . Feet on the ground . Left arm resting comfortably on a flat surface at the level of your heart . Legs uncrossed . Back supported . Sit quietly and don't  talk . Place the cuff on your bare arm . Adjust snuggly, so that only two fingertips can fit between your skin and the top of the cuff . Check 2 readings separated by at least one minute . Keep a log of your BP readings . For a visual, please reference this diagram: http://ccnc.care/bpdiagram  Provider Name: [facility]     Phone: [phone]  Zone 1: ALL CLEAR  Continue to monitor your symptoms:  . BP reading is less than 140 (top number) or less than 90 (bottom number)  . No right upper stomach pain . No headaches or seeing spots . No feeling nauseated or throwing up . No swelling in face and  hands  Zone 2: CAUTION Call your doctor's office for any of the following:  . BP reading is greater than 140 (top number) or greater than 90 (bottom number)  . Stomach pain under your ribs in the middle or right side . Headaches or seeing spots . Feeling nauseated or throwing up . Swelling in face and hands  Zone 3: EMERGENCY  Seek immediate medical care if you have any of the following:  . BP reading is greater than160 (top number) or greater than 110 (bottom number) . Severe headaches not improving with Tylenol . Serious difficulty catching your breath . Any worsening symptoms from Zone 2   Third Trimester of Pregnancy The third trimester is from week 29 through week 42, months 7 through 9. The third trimester is a time when the fetus is growing rapidly. At the end of the ninth month, the fetus is about 20 inches in length and weighs 6-10 pounds.  BODY CHANGES Your body goes through many changes during pregnancy. The changes vary from woman to woman.   Your weight will continue to increase. You can expect to gain 25-35 pounds (11-16 kg) by the end of the pregnancy.  You may begin to get stretch marks on your hips, abdomen, and breasts.  You may urinate more often because the fetus is moving lower into your pelvis and pressing on your bladder.  You may develop or continue to have heartburn as a result of your pregnancy.  You may develop constipation because certain hormones are causing the muscles that push waste through your intestines to slow down.  You may develop hemorrhoids or swollen, bulging veins (varicose veins).  You may have pelvic pain because of the weight gain and pregnancy hormones relaxing your joints between the bones in your pelvis. Backaches may result from overexertion of the muscles supporting your posture.  You may have changes in your hair. These can include thickening of your hair, rapid growth, and changes in texture. Some women also have hair loss during  or after pregnancy, or hair that feels dry or thin. Your hair will most likely return to normal after your baby is born.  Your breasts will continue to grow and be tender. A yellow discharge may leak from your breasts called colostrum.  Your belly button may stick out.  You may feel short of breath because of your expanding uterus.  You may notice the fetus "dropping," or moving lower in your abdomen.  You may have a bloody mucus discharge. This usually occurs a few days to a week before labor begins.  Your cervix becomes thin and soft (effaced) near your due date. WHAT TO EXPECT AT YOUR PRENATAL EXAMS  You will have prenatal exams every 2 weeks until week 36. Then, you will have weekly prenatal  exams. During a routine prenatal visit:  You will be weighed to make sure you and the fetus are growing normally.  Your blood pressure is taken.  Your abdomen will be measured to track your baby's growth.  The fetal heartbeat will be listened to.  Any test results from the previous visit will be discussed.  You may have a cervical check near your due date to see if you have effaced. At around 36 weeks, your caregiver will check your cervix. At the same time, your caregiver will also perform a test on the secretions of the vaginal tissue. This test is to determine if a type of bacteria, Group B streptococcus, is present. Your caregiver will explain this further. Your caregiver may ask you:  What your birth plan is.  How you are feeling.  If you are feeling the baby move.  If you have had any abnormal symptoms, such as leaking fluid, bleeding, severe headaches, or abdominal cramping.  If you have any questions. Other tests or screenings that may be performed during your third trimester include:  Blood tests that check for low iron levels (anemia).  Fetal testing to check the health, activity level, and growth of the fetus. Testing is done if you have certain medical conditions or if  there are problems during the pregnancy. FALSE LABOR You may feel small, irregular contractions that eventually go away. These are called Braxton Hicks contractions, or false labor. Contractions may last for hours, days, or even weeks before true labor sets in. If contractions come at regular intervals, intensify, or become painful, it is best to be seen by your caregiver.  SIGNS OF LABOR   Menstrual-like cramps.  Contractions that are 5 minutes apart or less.  Contractions that start on the top of the uterus and spread down to the lower abdomen and back.  A sense of increased pelvic pressure or back pain.  A watery or bloody mucus discharge that comes from the vagina. If you have any of these signs before the 37th week of pregnancy, call your caregiver right away. You need to go to the hospital to get checked immediately. HOME CARE INSTRUCTIONS   Avoid all smoking, herbs, alcohol, and unprescribed drugs. These chemicals affect the formation and growth of the baby.  Follow your caregiver's instructions regarding medicine use. There are medicines that are either safe or unsafe to take during pregnancy.  Exercise only as directed by your caregiver. Experiencing uterine cramps is a good sign to stop exercising.  Continue to eat regular, healthy meals.  Wear a good support bra for breast tenderness.  Do not use hot tubs, steam rooms, or saunas.  Wear your seat belt at all times when driving.  Avoid raw meat, uncooked cheese, cat litter boxes, and soil used by cats. These carry germs that can cause birth defects in the baby.  Take your prenatal vitamins.  Try taking a stool softener (if your caregiver approves) if you develop constipation. Eat more high-fiber foods, such as fresh vegetables or fruit and whole grains. Drink plenty of fluids to keep your urine clear or pale yellow.  Take warm sitz baths to soothe any pain or discomfort caused by hemorrhoids. Use hemorrhoid cream if your  caregiver approves.  If you develop varicose veins, wear support hose. Elevate your feet for 15 minutes, 3-4 times a day. Limit salt in your diet.  Avoid heavy lifting, wear low heal shoes, and practice good posture.  Rest a lot with your legs elevated  if you have leg cramps or low back pain.  Visit your dentist if you have not gone during your pregnancy. Use a soft toothbrush to brush your teeth and be gentle when you floss.  A sexual relationship may be continued unless your caregiver directs you otherwise.  Do not travel far distances unless it is absolutely necessary and only with the approval of your caregiver.  Take prenatal classes to understand, practice, and ask questions about the labor and delivery.  Make a trial run to the hospital.  Pack your hospital bag.  Prepare the baby's nursery.  Continue to go to all your prenatal visits as directed by your caregiver. SEEK MEDICAL CARE IF:  You are unsure if you are in labor or if your water has broken.  You have dizziness.  You have mild pelvic cramps, pelvic pressure, or nagging pain in your abdominal area.  You have persistent nausea, vomiting, or diarrhea.  You have a bad smelling vaginal discharge.  You have pain with urination. SEEK IMMEDIATE MEDICAL CARE IF:   You have a fever.  You are leaking fluid from your vagina.  You have spotting or bleeding from your vagina.  You have severe abdominal cramping or pain.  You have rapid weight loss or gain.  You have shortness of breath with chest pain.  You notice sudden or extreme swelling of your face, hands, ankles, feet, or legs.  You have not felt your baby move in over an hour.  You have severe headaches that do not go away with medicine.  You have vision changes. Document Released: 02/24/2001 Document Revised: 03/07/2013 Document Reviewed: 05/03/2012 [facility] Patient Information 2015 [place], [place]. This information is not intended to replace advice  given to you by your health care provider. Make sure you discuss any questions you have with your health care provider.

## 2020-02-22 NOTE — Progress Notes (Signed)
LOW-RISK PREGNANCY VISIT Patient name: Tracy Choi MRN [id number]  Date of birth: Mar 04, 1986 Chief Complaint:   Routine Prenatal Visit (PN2)  History of Present Illness:   Tracy Choi is a 34 y.o. G48P1001 female at [redacted]w[redacted]d with an Estimated Date of Delivery: 05/14/20 being seen today for ongoing management of a low-risk pregnancy.  Depression screen [facility] 2/9 02/22/2020 11/13/2019 05/12/2019 03/24/2018 03/12/2017  Decreased Interest 1 0 0 0 0  Down, Depressed, Hopeless 1 0 0 1 0  PHQ - 2 Score 2 0 0 1 0  Altered sleeping 1 3 - - -  Tired, decreased energy 0 3 - - -  Change in appetite 0 0 - - -  Feeling bad or failure about yourself  0 0 - - -  Trouble concentrating 0 0 - - -  Moving slowly or fidgety/restless 0 0 - - -  Suicidal thoughts 0 0 - - -  PHQ-9 Score 3 6 - - -  Difficult doing work/chores - Not difficult at all - - -    Today she reports bad spider and varicose veins, one in Lt upper thigh feels like someone is stabbing her, worried she could have a clot. Contractions: Not present. Vag. Bleeding: None.  Movement: Present. denies leaking of fluid. Review of Systems:   Pertinent items are noted in HPI Denies abnormal vaginal discharge w/ itching/odor/irritation, headaches, visual changes, shortness of breath, chest pain, abdominal pain, severe nausea/vomiting, or problems with urination or bowel movements unless otherwise stated above. Pertinent History Reviewed:  Reviewed past medical,surgical, social, obstetrical and family history.  Reviewed problem list, medications and allergies. Physical Assessment:   Vitals:   02/22/20 0923  BP: 123/69  Pulse: 83  Weight: 155 lb 12.8 oz (70.7 kg)  Body mass index is 26.74 kg/m.        Physical Examination:   General appearance: Well appearing, and in no distress  Mental status: Alert, oriented to person, place, and time  Skin: Warm & dry  Cardiovascular: Normal heart rate noted  Respiratory: Normal respiratory effort, no  distress  Abdomen: Soft, gravid, nontender  Pelvic: Cervical exam deferred         Extremities: Edema: None, bad varicose and spider veins, co-exam w/ LHE, no evidence of superficial clot, recommends ASA 81mg  and spankx  Fetal Status: Fetal Heart Rate (bpm): 140 Fundal Height: 25 cm Movement: Present    Chaperone: N/A   Results for orders placed or performed in visit on 02/22/20 (from the past 24 hour(s))  POC Urinalysis Dipstick OB   Collection Time: 02/22/20  9:28 AM  Result Value Ref Range   Color, UA     Clarity, UA     Glucose, UA Negative Negative   Bilirubin, UA     Ketones, UA neg    Spec Grav, UA     Blood, UA neg    pH, UA     POC,PROTEIN,UA Negative Negative, Trace, Small (1+), Moderate (2+), Large (3+), 4+   Urobilinogen, UA     Nitrite, UA neg    Leukocytes, UA Negative Negative   Appearance     Odor      Assessment & Plan:  1) Low-risk pregnancy G2P1001 at [redacted]w[redacted]d with an Estimated Date of Delivery: 05/14/20   2) Bad varicose & spider veins, ASA 81mg , get maternity compression panty hose or spankx (per LHE)  3) Uterine size <dates- will get EFW u/s   Meds: No orders of the defined types were  placed in this encounter.  Labs/procedures today: pn2, tdap  Plan:  Continue routine obstetrical care  Next visit: prefers will be in person for efw u/s    Reviewed: Preterm labor symptoms and general obstetric precautions including but not limited to vaginal bleeding, contractions, leaking of fluid and fetal movement were reviewed in detail with the patient.  All questions were answered. Does not have home bp cuff. Office bp given today. Check bp weekly, let us know if >140/90.   Follow-up: Return in about 4 weeks (around 03/21/2020) for LROB, CNM in office, ASAP efw u/s.  Future Appointments  Date Time Provider Department Center  02/22/2020  4:00 PM [facility] [place] [facility] None  03/25/2020  8:50 AM Grace Bushy, Merlene Laughter, CNM [facility]    Orders Placed This Encounter   Procedures  . US OB Follow Up  . Tdap vaccine greater than or equal to 7yo IM  . POC Urinalysis Dipstick OB   Cheral Marker CNM, [facility] 02/22/2020 9:53 AM

## 2020-02-23 LAB — CBC
Hematocrit: 34.5 % (ref 34.0–46.6)
Hemoglobin: 11.7 g/dL (ref 11.1–15.9)
MCH: 29.9 pg (ref 26.6–33.0)
MCHC: 33.9 g/dL (ref 31.5–35.7)
MCV: 88 fL (ref 79–97)
Platelets: 239 10*3/uL (ref 150–450)
RBC: 3.91 x10E6/uL (ref 3.77–5.28)
RDW: 12.3 % (ref 11.7–15.4)
WBC: 10.3 10*3/uL (ref 3.4–10.8)

## 2020-02-23 LAB — GLUCOSE TOLERANCE, 2 HOURS W/ 1HR
Glucose, 1 hour: 131 mg/dL (ref 65–179)
Glucose, 2 hour: 109 mg/dL (ref 65–152)
Glucose, Fasting: 90 mg/dL (ref 65–91)

## 2020-02-23 LAB — RPR: RPR Ser Ql: NONREACTIVE

## 2020-02-23 LAB — HIV ANTIBODY (ROUTINE TESTING W REFLEX): HIV Screen 4th Generation wRfx: NONREACTIVE

## 2020-02-23 LAB — ANTIBODY SCREEN: Antibody Screen: NEGATIVE

## 2020-02-26 DIAGNOSIS — E663 Overweight: Secondary | ICD-10-CM | POA: Diagnosis not present

## 2020-02-26 DIAGNOSIS — Z6827 Body mass index (BMI) 27.0-27.9, adult: Secondary | ICD-10-CM | POA: Diagnosis not present

## 2020-02-26 DIAGNOSIS — I8392 Asymptomatic varicose veins of left lower extremity: Secondary | ICD-10-CM | POA: Diagnosis not present

## 2020-03-16 NOTE — L&D Delivery Note (Signed)
OB/GYN Faculty Practice Delivery Note  Tracy Choi is a 35 y.o. G2P1001 s/p vaginal delivery at [redacted]w[redacted]d. She was admitted for spontaneous onset of labor.   ROM: 0h 35m with light meconium stained fluid GBS Status: negative Maximum Maternal Temperature: 98.26F  Labor Progress: On arrival, pt was found to be in active labor. She received her epidural and was noted to have complete cervical dilation at 2115. She than had AROM for light meconium stained fluid at 2118 with uncomplicated delivery as noted below.  Delivery Date/Time: 05/12/20 at 2125 Delivery: Called to room and patient was complete and pushing. Head delivered LOA. No nuchal cord present. Shoulder and body delivered in usual fashion. Infant with spontaneous cry, placed on mother's abdomen, dried and stimulated. Cord clamped x 2 after 1-minute delay, and cut by FOB under my direct supervision. Cord blood drawn. Placenta delivered spontaneously with gentle cord traction. Fundus firm with massage and Pitocin. Labia, perineum, vagina, and cervix were inspected, notable for hemostatic bilateral periurethral lacerations and first degree perineal laceration s/p repair in standard fashion with 3-0 vicryl.   Placenta: 3-vessel cord, intact, sent to L&D Complications: none Lacerations: hemostatic bilateral periurethral lacerations and first degree perineal laceration s/p repair in standard fashion with 3-0 vicryl.  EBL: 125 ml Analgesia: epidural, lidocaine with repair  Infant: viable female  APGARs 6 & 8  3016g  Lynnda Shields, MD OB/GYN Fellow, Faculty Practice

## 2020-03-25 ENCOUNTER — Ambulatory Visit (INDEPENDENT_AMBULATORY_CARE_PROVIDER_SITE_OTHER): Payer: Medicaid Other | Admitting: Women's Health

## 2020-03-25 ENCOUNTER — Other Ambulatory Visit: Payer: Self-pay

## 2020-03-25 ENCOUNTER — Encounter: Payer: Self-pay | Admitting: Women's Health

## 2020-03-25 ENCOUNTER — Ambulatory Visit (INDEPENDENT_AMBULATORY_CARE_PROVIDER_SITE_OTHER): Payer: Medicaid Other

## 2020-03-25 VITALS — BP 109/69 | HR 85 | Wt 155.0 lb

## 2020-03-25 DIAGNOSIS — O26843 Uterine size-date discrepancy, third trimester: Secondary | ICD-10-CM

## 2020-03-25 DIAGNOSIS — Z3483 Encounter for supervision of other normal pregnancy, third trimester: Secondary | ICD-10-CM

## 2020-03-25 DIAGNOSIS — Z331 Pregnant state, incidental: Secondary | ICD-10-CM

## 2020-03-25 DIAGNOSIS — Z3A32 32 weeks gestation of pregnancy: Secondary | ICD-10-CM

## 2020-03-25 DIAGNOSIS — Z1389 Encounter for screening for other disorder: Secondary | ICD-10-CM

## 2020-03-25 LAB — POCT URINALYSIS DIPSTICK OB
Blood, UA: NEGATIVE
Glucose, UA: NEGATIVE
Nitrite, UA: NEGATIVE
POC,PROTEIN,UA: NEGATIVE

## 2020-03-25 NOTE — Patient Instructions (Signed)
Tracy Choi, I greatly value your feedback.  If you receive a survey following your visit with us today, we appreciate you taking the time to fill it out.  Thanks, Kim Sennie Borden, CNM, WHNP-BC  [facility] ([address] [place], [place] [address]) Entrance C, located off of [address] Free 24/7 valet parking   Go to [email or web address] to register for FREE online childbirth classes    Call the office ([phone]) or go to [facility] if:  You begin to have strong, frequent contractions  Your water breaks.  Sometimes it is a big gush of fluid, sometimes it is just a trickle that keeps getting your panties wet or running down your legs  You have vaginal bleeding.  It is normal to have a small amount of spotting if your cervix was checked.   You don't feel your baby moving like normal.  If you don't, get you something to eat and drink and lay down and focus on feeling your baby move.  You should feel at least 10 movements in 2 hours.  If you don't, you should call the office or go to [facility].   Call the office ([phone]) or go to [facility] for these signs of pre-eclampsia:  Severe headache that does not go away with Tylenol  Visual changes- seeing spots, double, blurred vision  Pain under your right breast or upper abdomen that does not go away with Tums or heartburn medicine  Nausea and/or vomiting  Severe swelling in your hands, feet, and face    Home Blood Pressure Monitoring for Patients   Your provider has recommended that you check your blood pressure (BP) at least once a week at home. If you do not have a blood pressure cuff at home, one will be provided for you. Contact your provider if you have not received your monitor within 1 week.   Helpful Tips for Accurate Home Blood Pressure Checks  . Don't smoke, exercise, or drink caffeine 30 minutes before checking your BP . Use the restroom before checking your BP (a full  bladder can raise your pressure) . Relax in a comfortable upright chair . Feet on the ground . Left arm resting comfortably on a flat surface at the level of your heart . Legs uncrossed . Back supported . Sit quietly and don't talk . Place the cuff on your bare arm . Adjust snuggly, so that only two fingertips can fit between your skin and the top of the cuff . Check 2 readings separated by at least one minute . Keep a log of your BP readings . For a visual, please reference this diagram: http://ccnc.care/bpdiagram  Provider Name: [facility]     Phone: [phone]  Zone 1: ALL CLEAR  Continue to monitor your symptoms:  . BP reading is less than 140 (top number) or less than 90 (bottom number)  . No right upper stomach pain . No headaches or seeing spots . No feeling nauseated or throwing up . No swelling in face and hands  Zone 2: CAUTION Call your doctor's office for any of the following:  . BP reading is greater than 140 (top number) or greater than 90 (bottom number)  . Stomach pain under your ribs in the middle or right side . Headaches or seeing spots . Feeling nauseated or throwing up . Swelling in face and hands  Zone 3: EMERGENCY  Seek immediate medical care if you have any of   the following:  . BP reading is greater than160 (top number) or greater than 110 (bottom number) . Severe headaches not improving with Tylenol . Serious difficulty catching your breath . Any worsening symptoms from Zone 2  Preterm Labor and Birth Information  The normal length of a pregnancy is 39-41 weeks. Preterm labor is when labor starts before 37 completed weeks of pregnancy. What are the risk factors for preterm labor? Preterm labor is more likely to occur in women who:  Have certain infections during pregnancy such as a bladder infection, sexually transmitted infection, or infection inside the uterus (chorioamnionitis).  Have a shorter-than-normal cervix.  Have gone into  preterm labor before.  Have had surgery on their cervix.  Are younger than age 54 or older than age 25.  Are African American.  Are pregnant with twins or multiple babies (multiple gestation).  Take street drugs or smoke while pregnant.  Do not gain enough weight while pregnant.  Became pregnant shortly after having been pregnant. What are the symptoms of preterm labor? Symptoms of preterm labor include:  Cramps similar to those that can happen during a menstrual period. The cramps may happen with diarrhea.  Pain in the abdomen or lower back.  Regular uterine contractions that may feel like tightening of the abdomen.  A feeling of increased pressure in the pelvis.  Increased watery or bloody mucus discharge from the vagina.  Water breaking (ruptured amniotic sac). Why is it important to recognize signs of preterm labor? It is important to recognize signs of preterm labor because babies who are born prematurely may not be fully developed. This can put them at an increased risk for:  Long-term (chronic) heart and lung problems.  Difficulty immediately after birth with regulating body systems, including blood sugar, body temperature, heart rate, and breathing rate.  Bleeding in the brain.  Cerebral palsy.  Learning difficulties.  Death. These risks are highest for babies who are born before 48 weeks of pregnancy. How is preterm labor treated? Treatment depends on the length of your pregnancy, your condition, and the health of your baby. It may involve: 1. Having a stitch (suture) placed in your cervix to prevent your cervix from opening too early (cerclage). 2. Taking or being given medicines, such as: ? Hormone medicines. These may be given early in pregnancy to help support the pregnancy. ? Medicine to stop contractions. ? Medicines to help mature the baby's lungs. These may be prescribed if the risk of delivery is high. ? Medicines to prevent your baby from  developing cerebral palsy. If the labor happens before 34 weeks of pregnancy, you may need to stay in the hospital. What should I do if I think I am in preterm labor? If you think that you are going into preterm labor, call your health care provider right away. How can I prevent preterm labor in future pregnancies? To increase your chance of having a full-term pregnancy:  Do not use any tobacco products, such as cigarettes, chewing tobacco, and e-cigarettes. If you need help quitting, ask your health care provider.  Do not use street drugs or medicines that have not been prescribed to you during your pregnancy.  Talk with your health care provider before taking any herbal supplements, even if you have been taking them regularly.  Make sure you gain a healthy amount of weight during your pregnancy.  Watch for infection. If you think that you might have an infection, get it checked right away.  Make sure to  tell your health care provider if you have gone into preterm labor before. This information is not intended to replace advice given to you by your health care provider. Make sure you discuss any questions you have with your health care provider. Document Revised: 06/24/2018 Document Reviewed: 07/24/2015 Elsevier Patient Education  [employer].

## 2020-03-25 NOTE — Progress Notes (Signed)
[place] 32+6 wks,cephalic,anterior placenta gr 3,afi 10.5 cm,fhr 129 bpm,EFW 2051 g 39%

## 2020-03-25 NOTE — Progress Notes (Signed)
LOW-RISK PREGNANCY VISIT Patient name: Tracy Choi MRN [id number]  Date of birth: 1986-01-08 Chief Complaint:   Routine Prenatal Visit  History of Present Illness:   Tracy Choi is a 35 y.o. G96P1001 female at [redacted]w[redacted]d with an Estimated Date of Delivery: 05/14/20 being seen today for ongoing management of a low-risk pregnancy.  Depression screen [facility] 2/9 02/22/2020 11/13/2019 05/12/2019 03/24/2018 03/12/2017  Decreased Interest 1 0 0 0 0  Down, Depressed, Hopeless 1 0 0 1 0  PHQ - 2 Score 2 0 0 1 0  Altered sleeping 1 3 - - -  Tired, decreased energy 0 3 - - -  Change in appetite 0 0 - - -  Feeling bad or failure about yourself  0 0 - - -  Trouble concentrating 0 0 - - -  Moving slowly or fidgety/restless 0 0 - - -  Suicidal thoughts 0 0 - - -  PHQ-9 Score 3 6 - - -  Difficult doing work/chores - Not difficult at all - - -    Today she reports no complaints. Contractions: Irregular. Vag. Bleeding: None.  Movement: Present. denies leaking of fluid. Review of Systems:   Pertinent items are noted in HPI Denies abnormal vaginal discharge w/ itching/odor/irritation, headaches, visual changes, shortness of breath, chest pain, abdominal pain, severe nausea/vomiting, or problems with urination or bowel movements unless otherwise stated above. Pertinent History Reviewed:  Reviewed past medical,surgical, social, obstetrical and family history.  Reviewed problem list, medications and allergies. Physical Assessment:   Vitals:   03/25/20 0902  BP: 109/69  Pulse: 85  Weight: 155 lb (70.3 kg)  Body mass index is 26.61 kg/m.        Physical Examination:   General appearance: Well appearing, and in no distress  Mental status: Alert, oriented to person, place, and time  Skin: Warm & dry  Cardiovascular: Normal heart rate noted  Respiratory: Normal respiratory effort, no distress  Abdomen: Soft, gravid, nontender  Pelvic: Cervical exam deferred         Extremities: Edema: Trace  Fetal  Status: Fetal Heart Rate (bpm): 131 Fundal Height: 26 cm Movement: Present    Chaperone: N/A   Results for orders placed or performed in visit on 03/25/20 (from the past 24 hour(s))  POC Urinalysis Dipstick OB   Collection Time: 03/25/20  9:04 AM  Result Value Ref Range   Color, UA     Clarity, UA     Glucose, UA Negative Negative   Bilirubin, UA     Ketones, UA 3+    Spec Grav, UA     Blood, UA neg    pH, UA     POC,PROTEIN,UA Negative Negative, Trace, Small (1+), Moderate (2+), Large (3+), 4+   Urobilinogen, UA     Nitrite, UA neg    Leukocytes, UA Moderate (2+) (A) Negative   Appearance     Odor      Assessment & Plan:  1) Low-risk pregnancy G2P1001 at [redacted]w[redacted]d with an Estimated Date of Delivery: 05/14/20   2) Size <dates, efw 59% w/ normal AFI @ 28wks for same, will repeat as only 1cm growth in past 4wks   Meds: No orders of the defined types were placed in this encounter.  Labs/procedures today: none  Plan:  Continue routine obstetrical care  Next visit: prefers will be in person for u/s    Reviewed: Preterm labor symptoms and general obstetric precautions including but not limited to vaginal bleeding, contractions, leaking  of fluid and fetal movement were reviewed in detail with the patient.  All questions were answered. Has home bp cuff. Check bp weekly, let us know if >140/90.   Follow-up: Return for ASAP, US:EFW (no visit), then 2wks LROB w/ CNM.  Future Appointments  Date Time Provider Department Center  04/08/2020  8:50 AM Cheral Marker, CNM [facility]    Orders Placed This Encounter  Procedures  . US OB Follow Up  . POC Urinalysis Dipstick OB   Cheral Marker CNM, [facility] 03/25/2020 9:27 AM

## 2020-04-08 ENCOUNTER — Encounter: Payer: Self-pay | Admitting: Women's Health

## 2020-04-08 ENCOUNTER — Inpatient Hospital Stay (HOSPITAL_COMMUNITY)
Admission: AD | Admit: 2020-04-08 | Discharge: 2020-04-09 | Disposition: A | Discharge status: Home / Self Care | Payer: BC Managed Care – PPO | Attending: Obstetrics and Gynecology | Admitting: Obstetrics and Gynecology

## 2020-04-08 ENCOUNTER — Ambulatory Visit (INDEPENDENT_AMBULATORY_CARE_PROVIDER_SITE_OTHER): Payer: Medicaid Other | Admitting: Women's Health

## 2020-04-08 ENCOUNTER — Other Ambulatory Visit: Payer: Self-pay

## 2020-04-08 ENCOUNTER — Encounter (HOSPITAL_COMMUNITY): Payer: Self-pay | Admitting: Obstetrics and Gynecology

## 2020-04-08 VITALS — BP 110/60 | HR 74 | Wt 159.5 lb

## 2020-04-08 DIAGNOSIS — H538 Other visual disturbances: Secondary | ICD-10-CM | POA: Insufficient documentation

## 2020-04-08 DIAGNOSIS — K219 Gastro-esophageal reflux disease without esophagitis: Secondary | ICD-10-CM | POA: Insufficient documentation

## 2020-04-08 DIAGNOSIS — Z3A35 35 weeks gestation of pregnancy: Secondary | ICD-10-CM | POA: Insufficient documentation

## 2020-04-08 DIAGNOSIS — Z885 Allergy status to narcotic agent status: Secondary | ICD-10-CM | POA: Insufficient documentation

## 2020-04-08 DIAGNOSIS — O212 Late vomiting of pregnancy: Secondary | ICD-10-CM | POA: Insufficient documentation

## 2020-04-08 DIAGNOSIS — O99333 Smoking (tobacco) complicating pregnancy, third trimester: Secondary | ICD-10-CM | POA: Insufficient documentation

## 2020-04-08 DIAGNOSIS — Z3483 Encounter for supervision of other normal pregnancy, third trimester: Secondary | ICD-10-CM

## 2020-04-08 DIAGNOSIS — O99613 Diseases of the digestive system complicating pregnancy, third trimester: Secondary | ICD-10-CM | POA: Insufficient documentation

## 2020-04-08 DIAGNOSIS — O99891 Other specified diseases and conditions complicating pregnancy: Secondary | ICD-10-CM | POA: Insufficient documentation

## 2020-04-08 DIAGNOSIS — O26893 Other specified pregnancy related conditions, third trimester: Secondary | ICD-10-CM | POA: Insufficient documentation

## 2020-04-08 DIAGNOSIS — Z79899 Other long term (current) drug therapy: Secondary | ICD-10-CM | POA: Insufficient documentation

## 2020-04-08 DIAGNOSIS — O219 Vomiting of pregnancy, unspecified: Secondary | ICD-10-CM

## 2020-04-08 DIAGNOSIS — R079 Chest pain, unspecified: Secondary | ICD-10-CM | POA: Insufficient documentation

## 2020-04-08 DIAGNOSIS — F17210 Nicotine dependence, cigarettes, uncomplicated: Secondary | ICD-10-CM | POA: Insufficient documentation

## 2020-04-08 DIAGNOSIS — R109 Unspecified abdominal pain: Secondary | ICD-10-CM

## 2020-04-08 DIAGNOSIS — R10811 Right upper quadrant abdominal tenderness: Secondary | ICD-10-CM | POA: Insufficient documentation

## 2020-04-08 NOTE — Patient Instructions (Signed)
Aleen Sells, I greatly value your feedback.  If you receive a survey following your visit with [place] today, we appreciate you taking the time to fill it out.  Thanks, Joellyn Haff, CNM, WHNP-BC  [facility] ([address] [place], [place] [address]) Entrance C, located off of [address] valet parking   Go to [email or web address] to register for FREE online childbirth classes    Call the office [phone]) or go to [facility] if:  You begin to have strong, frequent contractions  Your water breaks.  Sometimes it is a big gush of fluid, sometimes it is just a trickle that keeps getting your panties wet or running down your legs  You have vaginal bleeding.  It is normal to have a small amount of spotting if your cervix was checked.   You don't feel your baby moving like normal.  If you don't, get you something to eat and drink and lay down and focus on feeling your baby move.  You should feel at least 10 movements in 2 hours.  If you don't, you should call the office or go to [facility].   Call the office [phone]) or go to [facility] hospital for these signs of pre-eclampsia:  Severe headache that does not go away with Tylenol  Visual changes- seeing spots, double, blurred vision  Pain under your right breast or upper abdomen that does not go away with Tums or heartburn medicine  Nausea and/or vomiting  Severe swelling in your hands, feet, and face    Home Blood Pressure Monitoring for Patients   Your provider has recommended that you check your blood pressure (BP) at least once a week at home. If you do not have a blood pressure cuff at home, one will be provided for you. Contact your provider if you have not received your monitor within 1 week.   Helpful Tips for Accurate Home Blood Pressure Checks  . Don't smoke, exercise, or drink caffeine 30 minutes before checking your BP . Use the restroom before checking your BP (a full  bladder can raise your pressure) . Relax in a comfortable upright chair . Feet on the ground . Left arm resting comfortably on a flat surface at the level of your heart . Legs uncrossed . Back supported . Sit quietly and don't talk . Place the cuff on your bare arm . Adjust snuggly, so that only two fingertips can fit between your skin and the top of the cuff . Check 2 readings separated by at least one minute . Keep a log of your BP readings . For a visual, please reference this diagram: http://ccnc.care/bpdiagram  Provider Name: [facility]     Phone: [phone]  Zone 1: ALL CLEAR  Continue to monitor your symptoms:  . BP reading is less than 140 (top number) or less than 90 (bottom number)  . No right upper stomach pain . No headaches or seeing spots . No feeling nauseated or throwing up . No swelling in face and hands  Zone 2: CAUTION Call your doctor's office for any of the following:  . BP reading is greater than 140 (top number) or greater than 90 (bottom number)  . Stomach pain under your ribs in the middle or right side . Headaches or seeing spots . Feeling nauseated or throwing up . Swelling in face and hands  Zone 3: EMERGENCY  Seek immediate medical care if you have any of  the following:  . BP reading is greater than160 (top number) or greater than 110 (bottom number) . Severe headaches not improving with Tylenol . Serious difficulty catching your breath . Any worsening symptoms from Zone 2  Preterm Labor and Birth Information  The normal length of a pregnancy is 39-41 weeks. Preterm labor is when labor starts before 37 completed weeks of pregnancy. What are the risk factors for preterm labor? Preterm labor is more likely to occur in women who:  Have certain infections during pregnancy such as a bladder infection, sexually transmitted infection, or infection inside the uterus (chorioamnionitis).  Have a shorter-than-normal cervix.  Have gone into  preterm labor before.  Have had surgery on their cervix.  Are younger than age 54 or older than age 25.  Are African American.  Are pregnant with twins or multiple babies (multiple gestation).  Take street drugs or smoke while pregnant.  Do not gain enough weight while pregnant.  Became pregnant shortly after having been pregnant. What are the symptoms of preterm labor? Symptoms of preterm labor include:  Cramps similar to those that can happen during a menstrual period. The cramps may happen with diarrhea.  Pain in the abdomen or lower back.  Regular uterine contractions that may feel like tightening of the abdomen.  A feeling of increased pressure in the pelvis.  Increased watery or bloody mucus discharge from the vagina.  Water breaking (ruptured amniotic sac). Why is it important to recognize signs of preterm labor? It is important to recognize signs of preterm labor because babies who are born prematurely may not be fully developed. This can put them at an increased risk for:  Long-term (chronic) heart and lung problems.  Difficulty immediately after birth with regulating body systems, including blood sugar, body temperature, heart rate, and breathing rate.  Bleeding in the brain.  Cerebral palsy.  Learning difficulties.  Death. These risks are highest for babies who are born before 48 weeks of pregnancy. How is preterm labor treated? Treatment depends on the length of your pregnancy, your condition, and the health of your baby. It may involve: 1. Having a stitch (suture) placed in your cervix to prevent your cervix from opening too early (cerclage). 2. Taking or being given medicines, such as: ? Hormone medicines. These may be given early in pregnancy to help support the pregnancy. ? Medicine to stop contractions. ? Medicines to help mature the baby's lungs. These may be prescribed if the risk of delivery is high. ? Medicines to prevent your baby from  developing cerebral palsy. If the labor happens before 34 weeks of pregnancy, you may need to stay in the hospital. What should I do if I think I am in preterm labor? If you think that you are going into preterm labor, call your health care provider right away. How can I prevent preterm labor in future pregnancies? To increase your chance of having a full-term pregnancy:  Do not use any tobacco products, such as cigarettes, chewing tobacco, and e-cigarettes. If you need help quitting, ask your health care provider.  Do not use street drugs or medicines that have not been prescribed to you during your pregnancy.  Talk with your health care provider before taking any herbal supplements, even if you have been taking them regularly.  Make sure you gain a healthy amount of weight during your pregnancy.  Watch for infection. If you think that you might have an infection, get it checked right away.  Make sure to  tell your health care provider if you have gone into preterm labor before. This information is not intended to replace advice given to you by your health care provider. Make sure you discuss any questions you have with your health care provider. Document Revised: 06/24/2018 Document Reviewed: 07/24/2015 Elsevier Patient Education  [employer].

## 2020-04-08 NOTE — Progress Notes (Signed)
   LOW-RISK PREGNANCY VISIT Patient name: Tracy Choi MRN [id number]  Date of birth: 1985-07-30 Chief Complaint:   Routine Prenatal Visit  History of Present Illness:   Tracy Choi is a 35 y.o. G80P1001 female at [redacted]w[redacted]d with an Estimated Date of Delivery: 05/14/20 being seen today for ongoing management of a low-risk pregnancy.  Depression screen [facility] 2/9 02/22/2020 11/13/2019 05/12/2019 03/24/2018 03/12/2017  Decreased Interest 1 0 0 0 0  Down, Depressed, Hopeless 1 0 0 1 0  PHQ - 2 Score 2 0 0 1 0  Altered sleeping 1 3 - - -  Tired, decreased energy 0 3 - - -  Change in appetite 0 0 - - -  Feeling bad or failure about yourself  0 0 - - -  Trouble concentrating 0 0 - - -  Moving slowly or fidgety/restless 0 0 - - -  Suicidal thoughts 0 0 - - -  PHQ-9 Score 3 6 - - -  Difficult doing work/chores - Not difficult at all - - -    Today she reports occ RUQ pain, no n/v or pre-e sx. Contractions: Not present. Vag. Bleeding: None.  Movement: Present. denies leaking of fluid. Review of Systems:   Pertinent items are noted in HPI Denies abnormal vaginal discharge w/ itching/odor/irritation, headaches, visual changes, shortness of breath, chest pain, abdominal pain, severe nausea/vomiting, or problems with urination or bowel movements unless otherwise stated above. Pertinent History Reviewed:  Reviewed past medical,surgical, social, obstetrical and family history.  Reviewed problem list, medications and allergies. Physical Assessment:   Vitals:   04/08/20 0902  BP: 110/60  Pulse: 74  Weight: 159 lb 8 oz (72.3 kg)  Body mass index is 27.38 kg/m.        Physical Examination:   General appearance: Well appearing, and in no distress  Mental status: Alert, oriented to person, place, and time  Skin: Warm & dry  Cardiovascular: Normal heart rate noted  Respiratory: Normal respiratory effort, no distress  Abdomen: Soft, gravid, nontender  Pelvic: Cervical exam deferred          Extremities: Edema: Trace  Fetal Status: Fetal Heart Rate (bpm): 140 Fundal Height: 29 cm Movement: Present    Chaperone: N/A   No results found for this or any previous visit (from the past 24 hour(s)).  Assessment & Plan:  1) Low-risk pregnancy G2P1001 at [redacted]w[redacted]d with an Estimated Date of Delivery: 05/14/20   2) RUQ pain, intermittent, sound like baby pushing into ribs. If develops new sx let us know   Meds: No orders of the defined types were placed in this encounter.  Labs/procedures today: none  Plan:  Continue routine obstetrical care  Next visit: prefers will be in person for gbs    Reviewed: Preterm labor symptoms and general obstetric precautions including but not limited to vaginal bleeding, contractions, leaking of fluid and fetal movement were reviewed in detail with the patient.  All questions were answered. Has home bp cuff.  Check bp weekly, let us know if >140/90.   Follow-up: Return in about 2 weeks (around 04/22/2020) for LROB, CNM, in person.  Future Appointments  Date Time Provider Department Center  04/22/2020  9:50 AM Cresenzo-Dishmon, Scarlette Calico, CNM [facility]    No orders of the defined types were placed in this encounter.  Cheral Marker CNM, [facility] 04/08/2020 9:38 AM

## 2020-04-08 NOTE — MAU Note (Signed)
..  Tracy Choi is a 35 y.o. at [redacted]w[redacted]d here in MAU reporting: Nausea and vomiting, swelling in hands and feet, pain under right breast. The nausea/vomiting began 30 minutes ago and she has had 2 episodes since then. The swelling in her feet began two days ago and her hands began this morning. The pain under her breast began last week, patient describes it as "it feels like a broken rib but when you press it you can tell its not a broken rib." She took Tums and has not had any relief. Denies headache. +FM. Denies ctx, leaking of fluid, or vaginal bleeding.  Pain score: 10/10 Vitals:   04/08/20 2320 04/08/20 2323  BP:  116/71  Pulse:  89  Resp: 16 18  Temp: 98.6 F (37 C) 98.6 F (37 C)  SpO2:  98%     FHT: 115 Lab orders placed from triage: UA

## 2020-04-09 ENCOUNTER — Inpatient Hospital Stay (HOSPITAL_COMMUNITY): Payer: BC Managed Care – PPO

## 2020-04-09 DIAGNOSIS — K219 Gastro-esophageal reflux disease without esophagitis: Secondary | ICD-10-CM | POA: Diagnosis not present

## 2020-04-09 DIAGNOSIS — O26893 Other specified pregnancy related conditions, third trimester: Secondary | ICD-10-CM

## 2020-04-09 DIAGNOSIS — R10811 Right upper quadrant abdominal tenderness: Secondary | ICD-10-CM | POA: Diagnosis not present

## 2020-04-09 DIAGNOSIS — O212 Late vomiting of pregnancy: Secondary | ICD-10-CM | POA: Diagnosis not present

## 2020-04-09 DIAGNOSIS — Z885 Allergy status to narcotic agent status: Secondary | ICD-10-CM | POA: Diagnosis not present

## 2020-04-09 DIAGNOSIS — O99333 Smoking (tobacco) complicating pregnancy, third trimester: Secondary | ICD-10-CM | POA: Diagnosis not present

## 2020-04-09 DIAGNOSIS — Z3A35 35 weeks gestation of pregnancy: Secondary | ICD-10-CM

## 2020-04-09 DIAGNOSIS — R079 Chest pain, unspecified: Secondary | ICD-10-CM

## 2020-04-09 DIAGNOSIS — O99613 Diseases of the digestive system complicating pregnancy, third trimester: Secondary | ICD-10-CM | POA: Diagnosis not present

## 2020-04-09 DIAGNOSIS — H538 Other visual disturbances: Secondary | ICD-10-CM | POA: Diagnosis not present

## 2020-04-09 DIAGNOSIS — R102 Pelvic and perineal pain: Secondary | ICD-10-CM

## 2020-04-09 DIAGNOSIS — Z79899 Other long term (current) drug therapy: Secondary | ICD-10-CM | POA: Diagnosis not present

## 2020-04-09 DIAGNOSIS — F17210 Nicotine dependence, cigarettes, uncomplicated: Secondary | ICD-10-CM | POA: Diagnosis not present

## 2020-04-09 DIAGNOSIS — O99891 Other specified diseases and conditions complicating pregnancy: Secondary | ICD-10-CM | POA: Diagnosis not present

## 2020-04-09 LAB — URINALYSIS, ROUTINE W REFLEX MICROSCOPIC
Bilirubin Urine: NEGATIVE
Glucose, UA: NEGATIVE mg/dL
Hgb urine dipstick: NEGATIVE
Ketones, ur: NEGATIVE mg/dL
Nitrite: NEGATIVE
Protein, ur: NEGATIVE mg/dL
Specific Gravity, Urine: 1.008 (ref 1.005–1.030)
pH: 7 (ref 5.0–8.0)

## 2020-04-09 LAB — CBC WITH DIFFERENTIAL/PLATELET
Abs Immature Granulocytes: 0.1 10*3/uL — ABNORMAL HIGH (ref 0.00–0.07)
Basophils Absolute: 0.1 10*3/uL (ref 0.0–0.1)
Basophils Relative: 0 %
Eosinophils Absolute: 0.2 10*3/uL (ref 0.0–0.5)
Eosinophils Relative: 2 %
HCT: 31.3 % — ABNORMAL LOW (ref 36.0–46.0)
Hemoglobin: 10.3 g/dL — ABNORMAL LOW (ref 12.0–15.0)
Immature Granulocytes: 1 %
Lymphocytes Relative: 21 %
Lymphs Abs: 2.4 10*3/uL (ref 0.7–4.0)
MCH: 29.6 pg (ref 26.0–34.0)
MCHC: 32.9 g/dL (ref 30.0–36.0)
MCV: 89.9 fL (ref 80.0–100.0)
Monocytes Absolute: 0.8 10*3/uL (ref 0.1–1.0)
Monocytes Relative: 7 %
Neutro Abs: 8 10*3/uL — ABNORMAL HIGH (ref 1.7–7.7)
Neutrophils Relative %: 69 %
Platelets: 155 10*3/uL (ref 150–400)
RBC: 3.48 MIL/uL — ABNORMAL LOW (ref 3.87–5.11)
RDW: 13.5 % (ref 11.5–15.5)
WBC: 11.5 10*3/uL — ABNORMAL HIGH (ref 4.0–10.5)
nRBC: 0 % (ref 0.0–0.2)

## 2020-04-09 LAB — COMPREHENSIVE METABOLIC PANEL
ALT: 18 U/L (ref 0–44)
AST: 16 U/L (ref 15–41)
Albumin: 2.5 g/dL — ABNORMAL LOW (ref 3.5–5.0)
Alkaline Phosphatase: 80 U/L (ref 38–126)
Anion gap: 8 (ref 5–15)
BUN: <5 mg/dL — ABNORMAL LOW (ref 6–20)
CO2: 23 mmol/L (ref 22–32)
Calcium: 9.1 mg/dL (ref 8.9–10.3)
Chloride: 108 mmol/L (ref 98–111)
Creatinine, Ser: 0.47 mg/dL (ref 0.44–1.00)
GFR, Estimated: >60 mL/min (ref >60)
Glucose, Bld: 100 mg/dL — ABNORMAL HIGH (ref 70–99)
Potassium: 2.9 mmol/L — ABNORMAL LOW (ref 3.5–5.1)
Sodium: 139 mmol/L (ref 135–145)
Total Bilirubin: 0.6 mg/dL (ref 0.3–1.2)
Total Protein: 4.5 g/dL — ABNORMAL LOW (ref 6.5–8.1)

## 2020-04-09 LAB — LIPASE, BLOOD: Lipase: 20 U/L (ref 11–51)

## 2020-04-09 MED ORDER — POTASSIUM CHLORIDE ER 10 MEQ PO TBCR: 10.0000 meq | EXTENDED_RELEASE_TABLET | Freq: Every day | ORAL | 0 refills | Status: DC

## 2020-04-09 NOTE — Discharge Instructions (Signed)

## 2020-04-09 NOTE — MAU Provider Note (Signed)
Chief Complaint:  Abdominal Pain and Emesis   Event Date/Time   First Provider Initiated Contact with Patient 04/09/20 0006     HPI: Tracy Choi is a 35 y.o. G2P1001 at 227w0d who presents to maternity admissions reporting right upper quadrant abdominal/chest pain. It is located just under her breast and spans from the 6th intercostal space to just under her rib cage. This pain began a week ago, it has not gotten progressively worse it has just been there. Today she also experienced two episodes of nausea/vomiting. She has a history of GERD. Also reports occasional blurry vision (none now) without presence of a headache and asymmetrical swelling in her lower extremities at varying times. Denies vaginal bleeding, leaking of fluid, decreased fetal movement, fever, falls, or recent illness.   Location: upper right quadrant Quality: aching Severity: 8/10 in pain scale Duration: one week Modifying factors: none Associated signs and symptoms: nausea/vomiting, swelling  Pregnancy Course: receives care at [facility]  Past Medical History:  Diagnosis Date  . Abnormal Pap smear   . Anxiety   . Asthma   . Bronchitis   . Contraceptive management 02/28/2013  . Depression   . GERD (gastroesophageal reflux disease)   . Herpes    do not let any family members know of the HSV  . 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 Vaginal Pap smear, abnormal    OB History  Gravida Para Term Preterm AB Living  2 1 1  0 0 1  SAB IAB Ectopic Multiple Live Births  0 0 0 0 1    # Outcome Date GA Lbr Len/2nd Weight Sex Delivery Anes PTL Lv  2 Current           1 Term 08/27/11 5939w2d 09:01 / 00:27 7 lb 5.8 oz (3.34 kg) M Vag-Spont EPI  LIV     Birth Comments: molding, but within normal limits   Past Surgical History:  Procedure Laterality Date  . WISDOM TOOTH EXTRACTION     Family History  Problem Relation Age of Onset  . Cancer Mother   . Alcohol abuse Mother   . Hypertension Mother    . Breast cancer Mother [id number]  . Hypertension Maternal Aunt   . Heart disease Maternal Aunt   . Diabetes Maternal Uncle   . Heart disease Maternal Uncle   . Hypertension Maternal Uncle   . Hypertension Paternal Aunt   . Hypertension Paternal Uncle   . Heart attack Paternal Uncle        has stents  . Heart disease Maternal Grandmother   . Hypertension Maternal Grandmother   . Stroke Maternal Grandmother   . Breast cancer Maternal Grandmother 60  . Heart disease Maternal Grandfather   . Hypertension Maternal Grandfather   . Stroke Maternal Grandfather   . Cancer Paternal Grandmother        leukemia  . Hypertension Paternal Grandmother   . Hypertension Paternal Grandfather   . Heart murmur Son   . Anesthesia problems Neg Hx    Social History   Tobacco Use  . Smoking status: Current Every Day Smoker    Packs/day: 0.50    Years: 15.00    Pack years: 7.50    Types: Cigarettes  . Smokeless tobacco: Former NeurosurgeonUser    Types: Engineer, drillingChew  Vaping Use  . Vaping Use: Former  Substance Use Topics  . Alcohol use: No  . Drug use: Not Currently    Types: Marijuana  Comment: last used 04/17/17   Allergies  Allergen Reactions  . Peanut-Containing Drug Products Anaphylaxis  . Vicodin [Hydrocodone-Acetaminophen] Nausea And Vomiting    Patient states that she gets sick to her stomach when medicine is "getting out of her system"   Medications Prior to Admission  Medication Sig Dispense Refill Last Dose  . acetaminophen (TYLENOL) 325 MG tablet Take 650 mg by mouth daily as needed. For pain   04/08/2020 at Unknown time  . acyclovir (ZOVIRAX) 400 MG tablet Take 400 mg by mouth 2 (two) times daily.    04/08/2020 at Unknown time  . albuterol (PROVENTIL) (2.5 MG/3ML) 0.083% nebulizer solution Take 2.5 mg by nebulization as needed for wheezing or shortness of breath.   04/08/2020 at Unknown time  . prenatal vitamin w/FE, FA (PRENATAL 1 + 1) 27-1 MG TABS tablet Take 1 tablet by mouth daily at 12 noon. 30  tablet 11 04/08/2020 at Unknown time  . albuterol (VENTOLIN HFA) 108 (90 Base) MCG/ACT inhaler Inhale 2 puffs into the lungs every 6 (six) hours as needed. For asthmatic bronchitis     . Blood Pressure Monitoring (BLOOD PRESSURE MONITOR AUTOMAT) DEVI Take BP at home daily.  Alert [place] if >140/90 more than once. (Patient taking differently: Take BP at home daily.  Alert [place] if >140/90 more than once.) 1 each 0   . diphenhydrAMINE (BENADRYL) 25 MG tablet Take 25 mg by mouth every 6 (six) hours as needed.      I have reviewed patient's Past Medical Hx, Surgical Hx, Family Hx, Social Hx, medications and allergies.   ROS:  Review of Systems  Constitutional: Negative for chills, fatigue and fever.  HENT: Negative for congestion and sore throat.   Eyes: Positive for visual disturbance.  Respiratory: Negative for cough, chest tightness and shortness of breath.   Gastrointestinal: Positive for abdominal pain, nausea and vomiting. Negative for abdominal distention.  Genitourinary: Negative for flank pain, vaginal bleeding and vaginal discharge.  Neurological: Negative for dizziness, syncope, light-headedness and headaches.   Physical Exam   Patient Vitals for the past 24 hrs:  BP Temp Temp src Pulse Resp SpO2 Height Weight  04/09/20 0200 -- -- -- -- -- 98 % -- --  04/09/20 0045 -- -- -- -- -- 96 % -- --  04/09/20 0013 -- -- -- -- -- 95 % -- --  04/08/20 2354 116/66 -- -- 78 -- -- -- --  04/08/20 2353 -- -- -- -- -- 96 % -- --  04/08/20 2323 116/71 98.6 F (37 C) Oral 89 18 98 % 5\' 5"  (1.651 m) 162 lb 9.6 oz (73.8 kg)  04/08/20 2320 -- 98.6 F (37 C) Oral -- 16 -- -- --   Constitutional: Well-developed, well-nourished female in no acute distress (was firmly asleep when I came to discuss lab report)  Cardiovascular: normal rate & rhythm, no murmur Respiratory: normal effort, lung sounds clear throughout GI: Abd soft, non-tender, gravid appropriate for gestational age. Pos BS x 4 MS: Extremities  nontender, mild +1 pitting edema bilaterally, normal ROM Neurologic: Alert and oriented x 4.  GU: no CVA tenderness Pelvic exam deferred  Fetal Tracing: reactive Baseline: 125 Variability: moderate Accelerations: present Decelerations: none Toco: relaxed   Labs: Results for orders placed or performed during the hospital encounter of 04/08/20 (from the past 24 hour(s))  Urinalysis, Routine w reflex microscopic Urine, Clean Catch     Status: Abnormal   Collection Time: 04/08/20 11:27 PM  Result Value Ref Range  Color, Urine STRAW (A) YELLOW   APPearance HAZY (A) CLEAR   Specific Gravity, Urine 1.008 1.005 - 1.030   pH 7.0 5.0 - 8.0   Glucose, UA NEGATIVE NEGATIVE mg/dL   Hgb urine dipstick NEGATIVE NEGATIVE   Bilirubin Urine NEGATIVE NEGATIVE   Ketones, ur NEGATIVE NEGATIVE mg/dL   Protein, ur NEGATIVE NEGATIVE mg/dL   Nitrite NEGATIVE NEGATIVE   Leukocytes,Ua SMALL (A) NEGATIVE   RBC / HPF 0-5 0 - 5 RBC/hpf   WBC, UA 6-10 0 - 5 WBC/hpf   Bacteria, UA RARE (A) NONE SEEN   Squamous Epithelial / LPF 0-5 0 - 5   Mucus PRESENT   CBC with Differential/Platelet     Status: Abnormal   Collection Time: 04/09/20 12:53 AM  Result Value Ref Range   WBC 11.5 (H) 4.0 - 10.5 K/uL   RBC 3.48 (L) 3.87 - 5.11 MIL/uL   Hemoglobin 10.3 (L) 12.0 - 15.0 g/dL   HCT 22.6 (L) 33.3 - 54.5 %   MCV 89.9 80.0 - 100.0 fL   MCH 29.6 26.0 - 34.0 pg   MCHC 32.9 30.0 - 36.0 g/dL   RDW 62.5 63.8 - 93.7 %   Platelets 155 150 - 400 K/uL   nRBC 0.0 0.0 - 0.2 %   Neutrophils Relative % 69 %   Neutro Abs 8.0 (H) 1.7 - 7.7 K/uL   Lymphocytes Relative 21 %   Lymphs Abs 2.4 0.7 - 4.0 K/uL   Monocytes Relative 7 %   Monocytes Absolute 0.8 0.1 - 1.0 K/uL   Eosinophils Relative 2 %   Eosinophils Absolute 0.2 0.0 - 0.5 K/uL   Basophils Relative 0 %   Basophils Absolute 0.1 0.0 - 0.1 K/uL   Immature Granulocytes 1 %   Abs Immature Granulocytes 0.10 (H) 0.00 - 0.07 K/uL  Comprehensive metabolic panel      Status: Abnormal   Collection Time: 04/09/20 12:53 AM  Result Value Ref Range   Sodium 139 135 - 145 mmol/L   Potassium 2.9 (L) 3.5 - 5.1 mmol/L   Chloride 108 98 - 111 mmol/L   CO2 23 22 - 32 mmol/L   Glucose, Bld 100 (H) 70 - 99 mg/dL   BUN <5 (L) 6 - 20 mg/dL   Creatinine, Ser 3.42 0.44 - 1.00 mg/dL   Calcium 9.1 8.9 - 87.6 mg/dL   Total Protein 4.5 (L) 6.5 - 8.1 g/dL   Albumin 2.5 (L) 3.5 - 5.0 g/dL   AST 16 15 - 41 U/L   ALT 18 0 - 44 U/L   Alkaline Phosphatase 80 38 - 126 U/L   Total Bilirubin 0.6 0.3 - 1.2 mg/dL   GFR, Estimated >81 >15 mL/min   Anion gap 8 5 - 15  Lipase, blood     Status: None   Collection Time: 04/09/20 12:53 AM  Result Value Ref Range   Lipase 20 11 - 51 U/L    Imaging:  DG CHEST PORT 1 VIEW  Result Date: 04/09/2020 CLINICAL DATA:  Chest pain EXAM: PORTABLE CHEST 1 VIEW COMPARISON:  06/15/2014 FINDINGS: The heart size and mediastinal contours are within normal limits. Both lungs are clear. The visualized skeletal structures are unremarkable. IMPRESSION: No active disease. Electronically Signed   By: Helyn Numbers MD   On: 04/09/2020 02:33    MAU Course: Orders Placed This Encounter  Procedures  . DG CHEST PORT 1 VIEW  . Urinalysis, Routine w reflex microscopic Urine, Clean Catch  . CBC  with Differential/Platelet  . Comprehensive metabolic panel  . Lipase, blood  . Discharge patient   Meds ordered this encounter  Medications  . potassium chloride (KLOR-CON) 10 MEQ tablet    Sig: Take 1 tablet (10 mEq total) by mouth daily.    Dispense:  5 tablet    Refill:  0    Order Specific Question:   Supervising Provider    Answer:   Samara Snide   MDM: Offered nausea or pain meds, pt declined stated neither are bad enough to take something  CXR clear Labs to rule out gallbladder pain normal Hypokalemia (2.9)  Discussed findings with patient and advised to take 5 days of K-Dur and use Tylenol for pain, pt amenable to  plan  Assessment: 1. Right sided abdominal pain   2. Nausea/vomiting in pregnancy   3. Chest pain at rest    Plan: Discharge home in stable condition with preterm labor precautions.     Follow-up Information    [facility]. Go to.   Specialty: Obstetrics and Gynecology Why: as scheduled for ongoing prenatal care Contact information: [address] [place] [address] [phone]              Allergies as of 04/09/2020      Reactions   Peanut-containing Drug Products Anaphylaxis   Vicodin [hydrocodone-acetaminophen] Nausea And Vomiting   Patient states that she gets sick to her stomach when medicine is "getting out of her system"      Medication List    TAKE these medications   acetaminophen 325 MG tablet Commonly known as: TYLENOL Take 650 mg by mouth daily as needed. For pain   acyclovir 400 MG tablet Commonly known as: ZOVIRAX Take 400 mg by mouth 2 (two) times daily.   albuterol 108 (90 Base) MCG/ACT inhaler Commonly known as: VENTOLIN HFA Inhale 2 puffs into the lungs every 6 (six) hours as needed. For asthmatic bronchitis   albuterol (2.5 MG/3ML) 0.083% nebulizer solution Commonly known as: PROVENTIL Take 2.5 mg by nebulization as needed for wheezing or shortness of breath.   Blood Pressure Monitor Automat Devi Take BP at home daily.  Alert [place] if >140/90 more than once.   diphenhydrAMINE 25 MG tablet Commonly known as: BENADRYL Take 25 mg by mouth every 6 (six) hours as needed.   potassium chloride 10 MEQ tablet Commonly known as: KLOR-CON Take 1 tablet (10 mEq total) by mouth daily.   prenatal vitamin w/FE, FA 27-1 MG Tabs tablet Take 1 tablet by mouth daily at 12 noon.      Edd Arbour, CNM, MSN, IBCLC Certified Nurse Midwife, [facility]

## 2020-04-22 ENCOUNTER — Encounter: Payer: Medicaid Other | Admitting: Advanced Practice Midwife

## 2020-04-24 ENCOUNTER — Encounter: Payer: Medicaid Other | Admitting: Obstetrics and Gynecology

## 2020-04-25 ENCOUNTER — Other Ambulatory Visit: Payer: Self-pay

## 2020-04-25 ENCOUNTER — Other Ambulatory Visit (HOSPITAL_COMMUNITY)
Admission: RE | Admit: 2020-04-25 | Discharge: 2020-04-25 | Disposition: A | Discharge status: Home / Self Care | Payer: BC Managed Care – PPO | Source: Ambulatory Visit | Attending: Advanced Practice Midwife | Admitting: Advanced Practice Midwife

## 2020-04-25 ENCOUNTER — Ambulatory Visit (INDEPENDENT_AMBULATORY_CARE_PROVIDER_SITE_OTHER): Payer: Medicaid Other | Admitting: Obstetrics & Gynecology

## 2020-04-25 VITALS — BP 132/74 | HR 89 | Wt 157.4 lb

## 2020-04-25 DIAGNOSIS — Z3483 Encounter for supervision of other normal pregnancy, third trimester: Secondary | ICD-10-CM

## 2020-04-25 DIAGNOSIS — Z3A37 37 weeks gestation of pregnancy: Secondary | ICD-10-CM | POA: Diagnosis not present

## 2020-04-25 NOTE — Progress Notes (Signed)
   LOW-RISK PREGNANCY VISIT Patient name: Tracy Choi MRN [id number]  Date of birth: April 24, 1985 Chief Complaint:   Routine Prenatal Visit History of Present Illness:   Tracy Choi is a 35 y.o. G101P1001 female at [redacted]w[redacted]d with an Estimated Date of Delivery: 05/14/20 being seen today for ongoing management of a low-risk pregnancy.  Depression screen [facility] 2/9 02/22/2020 11/13/2019 05/12/2019 03/24/2018 03/12/2017  Decreased Interest 1 0 0 0 0  Down, Depressed, Hopeless 1 0 0 1 0  PHQ - 2 Score 2 0 0 1 0  Altered sleeping 1 3 - - -  Tired, decreased energy 0 3 - - -  Change in appetite 0 0 - - -  Feeling bad or failure about yourself  0 0 - - -  Trouble concentrating 0 0 - - -  Moving slowly or fidgety/restless 0 0 - - -  Suicidal thoughts 0 0 - - -  PHQ-9 Score 3 6 - - -  Difficult doing work/chores - Not difficult at all - - -    Today she reports no complaints. Contractions: Not present. Vag. Bleeding: None.  Movement: Present. denies leaking of fluid. Review of Systems:   Pertinent items are noted in HPI Denies abnormal vaginal discharge w/ itching/odor/irritation, headaches, visual changes, shortness of breath, chest pain, abdominal pain, severe nausea/vomiting, or problems with urination or bowel movements unless otherwise stated above. Pertinent History Reviewed:  Reviewed past medical,surgical, social, obstetrical and family history.  Reviewed problem list, medications and allergies. Physical Assessment:  Vitals:   04/25/20 0944  BP: 132/74  Pulse: 89  Weight: 157 lb 6.4 oz (71.4 kg)  Body mass index is 26.19 kg/m.        Physical Examination:   General appearance: Well appearing, and in no distress  Mental status: Alert, oriented to person, place, and time  Skin: Warm & dry  Cardiovascular: Normal heart rate noted  Respiratory: Normal respiratory effort, no distress  Abdomen: Soft, gravid, nontender  Pelvic: 2/thick/-2         Extremities: Edema: Trace  Fetal Status:      Movement: Present    Chaperone: n/a    No results found for this or any previous visit (from the past 24 hour(s)).  Assessment & Plan:  1) Low-risk pregnancy G2P1001 at [redacted]w[redacted]d with an Estimated Date of Delivery: 05/14/20   2) doing well no complaints continue routine ob care, ,   Meds: No orders of the defined types were placed in this encounter. Labs/procedures today:  Plan:  Continue routine obstetrical care , Next visit: prefers in person    Reviewed: Term labor symptoms and general obstetric precautions including but not limited to vaginal bleeding, contractions, leaking of fluid and fetal movement were reviewed in detail with the patient.  All questions were answered. has home bp cuff. Rx faxed to n/a. Check bp weekly, let us know if >140/90.   Follow-up: No follow-ups on file.  No orders of the defined types were placed in this encounter.  Lazaro Arms, MD 04/25/2020 10:12 AM

## 2020-04-26 ENCOUNTER — Inpatient Hospital Stay (HOSPITAL_COMMUNITY)
Admission: AD | Admit: 2020-04-26 | Discharge: 2020-04-26 | Disposition: A | Discharge status: Home / Self Care | Payer: BC Managed Care – PPO | Attending: Family Medicine | Admitting: Family Medicine

## 2020-04-26 ENCOUNTER — Other Ambulatory Visit: Payer: Self-pay

## 2020-04-26 ENCOUNTER — Encounter (HOSPITAL_COMMUNITY): Payer: Self-pay | Admitting: Family Medicine

## 2020-04-26 DIAGNOSIS — O471 False labor at or after 37 completed weeks of gestation: Secondary | ICD-10-CM

## 2020-04-26 DIAGNOSIS — Z3A37 37 weeks gestation of pregnancy: Secondary | ICD-10-CM | POA: Diagnosis not present

## 2020-04-26 DIAGNOSIS — O479 False labor, unspecified: Secondary | ICD-10-CM

## 2020-04-26 LAB — URINALYSIS, ROUTINE W REFLEX MICROSCOPIC
Bilirubin Urine: NEGATIVE
Glucose, UA: NEGATIVE mg/dL
Ketones, ur: 80 mg/dL — AB
Nitrite: NEGATIVE
Protein, ur: 30 mg/dL — AB
RBC / HPF: >50 RBC/hpf — ABNORMAL HIGH (ref 0–5)
Specific Gravity, Urine: 1.016 (ref 1.005–1.030)
pH: 6 (ref 5.0–8.0)

## 2020-04-26 LAB — CERVICOVAGINAL ANCILLARY ONLY
Chlamydia: NEGATIVE
Comment: NEGATIVE
Comment: NORMAL
Neisseria Gonorrhea: NEGATIVE

## 2020-04-26 NOTE — MAU Note (Signed)
Tracy Choi is a 35 y.o. at [redacted]w[redacted]d here in MAU reporting: went to the bathroom before leaving work and noticed her urine looked pink and is also having some cramping. Cramping seems to be getting worse. No LOF. +FM  Onset of complaint: today  Pain score: 3/10  Vitals:   04/26/20 1709  BP: 124/69  Pulse: (!) 102  Resp: 16  Temp: 98.3 F (36.8 C)  SpO2: 98%     FHT: 126  Lab orders placed from triage: UA

## 2020-04-26 NOTE — MAU Provider Note (Signed)
History     CSN: [id number]  Arrival date and time: 04/26/20 1653   Event Date/Time   First Provider Initiated Contact with Patient 04/26/20 1746      Chief Complaint  Patient presents with  . Abdominal Pain  . Vaginal Bleeding   HPI Tracy Choi is a 35 y.o. G2P1001 at [redacted]w[redacted]d who presents with abdominal cramping & vaginal bleeding. States she used the bathroom while at work and saw pink in the toilet. No pink or blood when used the bathroom in MAU. Has had intermittent abdominal cramping today that has been worse since seeing the blood. Rates pain 3/10. Nothing makes better or worse. Hasn't been able to track how frequently it occurs. Denies fever, flank pain, n/v, dysuria, or LOF. No recent intercourse. Her cervix was checked in the office yesterday.  Reports good fetal movement.   OB History    Gravida  2   Para  1   Term  1   Preterm  0   AB  0   Living  1     SAB  0   IAB  0   Ectopic  0   Multiple  0   Live Births  1           Past Medical History:  Diagnosis Date  . Abnormal Pap smear   . Anxiety   . Asthma   . Bronchitis   . Depression   . GERD (gastroesophageal reflux disease)   . Herpes    do not let any family members know of the HSV    Past Surgical History:  Procedure Laterality Date  . WISDOM TOOTH EXTRACTION      Family History  Problem Relation Age of Onset  . Cancer Mother   . Alcohol abuse Mother   . Hypertension Mother   . Breast cancer Mother 7  . Hypertension Maternal Aunt   . Heart disease Maternal Aunt   . Diabetes Maternal Uncle   . Heart disease Maternal Uncle   . Hypertension Maternal Uncle   . Hypertension Paternal Aunt   . Hypertension Paternal Uncle   . Heart attack Paternal Uncle        has stents  . Heart disease Maternal Grandmother   . Hypertension Maternal Grandmother   . Stroke Maternal Grandmother   . Breast cancer Maternal Grandmother 60  . Heart disease Maternal Grandfather   .  Hypertension Maternal Grandfather   . Stroke Maternal Grandfather   . Cancer Paternal Grandmother        leukemia  . Hypertension Paternal Grandmother   . Hypertension Paternal Grandfather   . Heart murmur Son   . Anesthesia problems Neg Hx     Social History   Tobacco Use  . Smoking status: Current Every Day Smoker    Packs/day: 0.50    Years: 15.00    Pack years: 7.50    Types: Cigarettes  . Smokeless tobacco: Former Neurosurgeon    Types: Engineer, drilling  . Vaping Use: Former  Substance Use Topics  . Alcohol use: No  . Drug use: Not Currently    Types: Marijuana    Comment: last used 04/17/17    Allergies:  Allergies  Allergen Reactions  . Peanut-Containing Drug Products Anaphylaxis  . Vicodin [Hydrocodone-Acetaminophen] Nausea And Vomiting    Patient states that she gets sick to her stomach when medicine is "getting out of her system"    Medications Prior to Admission  Medication Sig  Dispense Refill Last Dose  . acetaminophen (TYLENOL) 325 MG tablet Take 650 mg by mouth daily as needed. For pain   04/26/2020 at Unknown time  . acyclovir (ZOVIRAX) 400 MG tablet Take 400 mg by mouth 2 (two) times daily. Pt taking once a day   04/26/2020 at Unknown time  . diphenhydrAMINE (BENADRYL) 25 MG tablet Take 50 mg by mouth every 6 (six) hours as needed for allergies. Pt states she has been taking 50mg  in the am and at night   04/26/2020 at Unknown time  . prenatal vitamin w/FE, FA (PRENATAL 1 + 1) 27-1 MG TABS tablet Take 1 tablet by mouth daily at 12 noon. 30 tablet 11 04/26/2020 at Unknown time  . albuterol (PROVENTIL) (2.5 MG/3ML) 0.083% nebulizer solution Take 2.5 mg by nebulization as needed for wheezing or shortness of breath.     06/24/2020 albuterol (VENTOLIN HFA) 108 (90 Base) MCG/ACT inhaler Inhale 2 puffs into the lungs every 6 (six) hours as needed. For asthmatic bronchitis     . Blood Pressure Monitoring (BLOOD PRESSURE MONITOR AUTOMAT) DEVI Take BP at home daily.  Alert Marland Kitchen if >140/90  more than once. (Patient taking differently: Take BP at home daily.  Alert [place] if >140/90 more than once.) 1 each 0   . potassium chloride (KLOR-CON) 10 MEQ tablet Take 1 tablet (10 mEq total) by mouth daily. 5 tablet 0     Review of Systems  Constitutional: Negative.   Gastrointestinal: Positive for abdominal pain.  Genitourinary: Negative for dysuria and flank pain.       +vaginal bleeding vs hematuria   Physical Exam   Blood pressure 110/69, pulse 98, temperature 98.3 F (36.8 C), temperature source Oral, resp. rate 16, height 5\' 5"  (1.651 m), weight 69.9 kg, SpO2 98 %.  Physical Exam Vitals and nursing note reviewed. Exam conducted with a chaperone present.  Constitutional:      General: She is not in acute distress.    Appearance: She is well-developed.  HENT:     Head: Normocephalic and atraumatic.  Pulmonary:     Effort: Pulmonary effort is normal. No respiratory distress.  Abdominal:     Palpations: Abdomen is soft.     Tenderness: There is no abdominal tenderness.  Genitourinary:    General: Normal vulva.     Vagina: No bleeding.     Comments: Dilation: 1.5 Effacement (%): 40 Cervical Position: Posterior Station: -3 Presentation: Vertex Exam by:: NP  Neurological:     Mental Status: She is alert.    NST:  Baseline: 130 bpm, Variability: Good {> 6 bpm), Accelerations: Reactive and Decelerations: Absent  MAU Course  Procedures Results for orders placed or performed during the hospital encounter of 04/26/20 (from the past 24 hour(s))  Urinalysis, Routine w reflex microscopic Urine, Clean Catch     Status: Abnormal   Collection Time: 04/26/20  5:20 PM  Result Value Ref Range   Color, Urine YELLOW YELLOW   APPearance HAZY (A) CLEAR   Specific Gravity, Urine 1.016 1.005 - 1.030   pH 6.0 5.0 - 8.0   Glucose, UA NEGATIVE NEGATIVE mg/dL   Hgb urine dipstick LARGE (A) NEGATIVE   Bilirubin Urine NEGATIVE NEGATIVE   Ketones, ur 80 (A) NEGATIVE mg/dL    Protein, ur 30 (A) NEGATIVE mg/dL   Nitrite NEGATIVE NEGATIVE   Leukocytes,Ua TRACE (A) NEGATIVE   RBC / HPF >50 (H) 0 - 5 RBC/hpf   WBC, UA 6-10 0 - 5 WBC/hpf  Bacteria, UA FEW (A) NONE SEEN   Squamous Epithelial / LPF 0-5 0 - 5   Mucus PRESENT     MDM Reactive fetal tracing & irregular contractions on toco. Abdomen soft & non tender. No blood on exam. Cervical exam is comparable to exam done in office yesterday; do not suspect labor.  RH positive.   U/a looks suspicious for infection; pt without urinary complaints. Will culture urine & treat accordingly.   Assessment and Plan   1. False labor   2. [redacted] weeks gestation of pregnancy    -discussed reasons to return to MAU -urine culture pending  Judeth Horn 04/26/2020, 5:46 PM

## 2020-04-26 NOTE — Discharge Instructions (Signed)
Return to care   If you have heavier bleeding that soaks through more that 2 pads per hour for an hour or more  If you bleed so much that you feel like you might pass out or you do pass out  If you have significant abdominal pain that is not improved with Tylenol    Fetal Movement Counts Patient Name: ________________________________________________ Patient Due Date: ____________________  What is a fetal movement count? A fetal movement count is the number of times that you feel your baby move during a certain amount of time. This may also be called a fetal kick count. A fetal movement count is recommended for every pregnant woman. You may be asked to start counting fetal movements as early as week 28 of your pregnancy. Pay attention to when your baby is most active. You may notice your baby's sleep and wake cycles. You may also notice things that make your baby move more. You should do a fetal movement count:  When your baby is normally most active.  At the same time each day. A good time to count movements is while you are resting, after having something to eat and drink. How do I count fetal movements? 1. Find a quiet, comfortable area. Sit, or lie down on your side. 2. Write down the date, the start time and stop time, and the number of movements that you felt between those two times. Take this information with you to your health care visits. 3. Write down your start time when you feel the first movement. 4. Count kicks, flutters, swishes, rolls, and jabs. You should feel at least 10 movements. 5. You may stop counting after you have felt 10 movements, or if you have been counting for 2 hours. Write down the stop time. 6. If you do not feel 10 movements in 2 hours, contact your health care provider for further instructions. Your health care provider may want to do additional tests to assess your baby's well-being. Contact a health care provider if:  You feel fewer than 10 movements  in 2 hours.  Your baby is not moving like he or she usually does. Date: ____________ Start time: ____________ Stop time: ____________ Movements: ____________ Date: ____________ Start time: ____________ Stop time: ____________ Movements: ____________ Date: ____________ Start time: ____________ Stop time: ____________ Movements: ____________ Date: ____________ Start time: ____________ Stop time: ____________ Movements: ____________ Date: ____________ Start time: ____________ Stop time: ____________ Movements: ____________ Date: ____________ Start time: ____________ Stop time: ____________ Movements: ____________ Date: ____________ Start time: ____________ Stop time: ____________ Movements: ____________ Date: ____________ Start time: ____________ Stop time: ____________ Movements: ____________ Date: ____________ Start time: ____________ Stop time: ____________ Movements: ____________ This information is not intended to replace advice given to you by your health care provider. Make sure you discuss any questions you have with your health care provider. Document Revised: 10/20/2018 Document Reviewed: 10/20/2018 Elsevier Patient Education  2021 [employer].

## 2020-04-28 LAB — CULTURE, OB URINE
Culture: <10000 — AB
Special Requests: NORMAL

## 2020-04-29 LAB — CULTURE, BETA STREP (GROUP B ONLY): Strep Gp B Culture: NEGATIVE

## 2020-05-06 ENCOUNTER — Other Ambulatory Visit: Payer: Self-pay

## 2020-05-06 ENCOUNTER — Ambulatory Visit (INDEPENDENT_AMBULATORY_CARE_PROVIDER_SITE_OTHER): Payer: BC Managed Care – PPO | Admitting: Women's Health

## 2020-05-06 ENCOUNTER — Encounter: Payer: Self-pay | Admitting: Women's Health

## 2020-05-06 VITALS — BP 124/76 | HR 85 | Wt 156.0 lb

## 2020-05-06 DIAGNOSIS — Z3483 Encounter for supervision of other normal pregnancy, third trimester: Secondary | ICD-10-CM

## 2020-05-06 NOTE — Progress Notes (Signed)
   LOW-RISK PREGNANCY VISIT Patient name: Tracy Choi MRN [id number]  Date of birth: Jun 13, 1985 Chief Complaint:   Routine Prenatal Visit  History of Present Illness:   Tracy Choi is a 35 y.o. G17P1001 female at [redacted]w[redacted]d with an Estimated Date of Delivery: 05/14/20 being seen today for ongoing management of a low-risk pregnancy.  Depression screen [facility] 2/9 02/22/2020 11/13/2019 05/12/2019 03/24/2018 03/12/2017  Decreased Interest 1 0 0 0 0  Down, Depressed, Hopeless 1 0 0 1 0  PHQ - 2 Score 2 0 0 1 0  Altered sleeping 1 3 - - -  Tired, decreased energy 0 3 - - -  Change in appetite 0 0 - - -  Feeling bad or failure about yourself  0 0 - - -  Trouble concentrating 0 0 - - -  Moving slowly or fidgety/restless 0 0 - - -  Suicidal thoughts 0 0 - - -  PHQ-9 Score 3 6 - - -  Difficult doing work/chores - Not difficult at all - - -    Today she reports no complaints. Has decided she wants BTL. Contractions: Not present. Vag. Bleeding: None.  Movement: Present. denies leaking of fluid. Review of Systems:   Pertinent items are noted in HPI Denies abnormal vaginal discharge w/ itching/odor/irritation, headaches, visual changes, shortness of breath, chest pain, abdominal pain, severe nausea/vomiting, or problems with urination or bowel movements unless otherwise stated above. Pertinent History Reviewed:  Reviewed past medical,surgical, social, obstetrical and family history.  Reviewed problem list, medications and allergies. Physical Assessment:   Vitals:   05/06/20 1347  BP: 124/76  Pulse: 85  Weight: 156 lb (70.8 kg)  Body mass index is 25.96 kg/m.        Physical Examination:   General appearance: Well appearing, and in no distress  Mental status: Alert, oriented to person, place, and time  Skin: Warm & dry  Cardiovascular: Normal heart rate noted  Respiratory: Normal respiratory effort, no distress  Abdomen: Soft, gravid, nontender  Pelvic: Cervical exam performed  Dilation: 2.5  Effacement (%): Thick Station: -2  Extremities: Edema: Trace  Fetal Status: Fetal Heart Rate (bpm): 145 Fundal Height: 30 cm Movement: Present Presentation: Vertex  Chaperone: Faith Rogue   No results found for this or any previous visit (from the past 24 hour(s)).  Assessment & Plan:  1) Low-risk pregnancy G2P1001 at [redacted]w[redacted]d with an Estimated Date of Delivery: 05/14/20   2) Persistent size <dates, 8 below today, last efw 39% at 32.6wks, will repeat  3) Wants BTL> understands will have to be interval at this point. Reviewed risks/benefits,  LARCs just as effective, consent signed today. Plans depo in hospital x 1   Meds: No orders of the defined types were placed in this encounter.  Labs/procedures today: SVE  Plan:  Continue routine obstetrical care  Next visit: prefers in person    Reviewed: Term labor symptoms and general obstetric precautions including but not limited to vaginal bleeding, contractions, leaking of fluid and fetal movement were reviewed in detail with the patient.  All questions were answered. Has home bp cuff. Check bp weekly, let us know if >140/90.   Follow-up: Return in about 1 week (around 05/13/2020) for LROB & EFW u/s, CNM, in person, Sign BTL consent today.  No future appointments.  No orders of the defined types were placed in this encounter.  Cheral Marker CNM, [facility] 05/06/2020 2:16 PM

## 2020-05-06 NOTE — Patient Instructions (Signed)
Aleen Sells, I greatly value your feedback.  If you receive a survey following your visit with [place] today, we appreciate you taking the time to fill it out.  Thanks, Joellyn Haff, CNM, WHNP-BC  [facility] ([address] [place], [place] [address]) Entrance C, located off of [address] valet parking   Go to [email or web address] to register for FREE online childbirth classes    Call the office [phone]) or go to [facility] if:  You begin to have strong, frequent contractions  Your water breaks.  Sometimes it is a big gush of fluid, sometimes it is just a trickle that keeps getting your panties wet or running down your legs  You have vaginal bleeding.  It is normal to have a small amount of spotting if your cervix was checked.   You don't feel your baby moving like normal.  If you don't, get you something to eat and drink and lay down and focus on feeling your baby move.  You should feel at least 10 movements in 2 hours.  If you don't, you should call the office or go to [facility].   Call the office [phone]) or go to [facility] hospital for these signs of pre-eclampsia:  Severe headache that does not go away with Tylenol  Visual changes- seeing spots, double, blurred vision  Pain under your right breast or upper abdomen that does not go away with Tums or heartburn medicine  Nausea and/or vomiting  Severe swelling in your hands, feet, and face    Home Blood Pressure Monitoring for Patients   Your provider has recommended that you check your blood pressure (BP) at least once a week at home. If you do not have a blood pressure cuff at home, one will be provided for you. Contact your provider if you have not received your monitor within 1 week.   Helpful Tips for Accurate Home Blood Pressure Checks  . Don't smoke, exercise, or drink caffeine 30 minutes before checking your BP . Use the restroom before checking your BP (a full  bladder can raise your pressure) . Relax in a comfortable upright chair . Feet on the ground . Left arm resting comfortably on a flat surface at the level of your heart . Legs uncrossed . Back supported . Sit quietly and don't talk . Place the cuff on your bare arm . Adjust snuggly, so that only two fingertips can fit between your skin and the top of the cuff . Check 2 readings separated by at least one minute . Keep a log of your BP readings . For a visual, please reference this diagram: http://ccnc.care/bpdiagram  Provider Name: [facility]     Phone: [phone]  Zone 1: ALL CLEAR  Continue to monitor your symptoms:  . BP reading is less than 140 (top number) or less than 90 (bottom number)  . No right upper stomach pain . No headaches or seeing spots . No feeling nauseated or throwing up . No swelling in face and hands  Zone 2: CAUTION Call your doctor's office for any of the following:  . BP reading is greater than 140 (top number) or greater than 90 (bottom number)  . Stomach pain under your ribs in the middle or right side . Headaches or seeing spots . Feeling nauseated or throwing up . Swelling in face and hands  Zone 3: EMERGENCY  Seek immediate medical care if you have any of  the following:  . BP reading is greater than160 (top number) or greater than 110 (bottom number) . Severe headaches not improving with Tylenol . Serious difficulty catching your breath . Any worsening symptoms from Zone 2   Braxton Hicks Contractions Contractions of the uterus can occur throughout pregnancy, but they are not always a sign that you are in labor. You may have practice contractions called Braxton Hicks contractions. These false labor contractions are sometimes confused with true labor. What are Braxton Hicks contractions? Braxton Hicks contractions are tightening movements that occur in the muscles of the uterus before labor. Unlike true labor contractions, these  contractions do not result in opening (dilation) and thinning of the cervix. Toward the end of pregnancy (32-34 weeks), Braxton Hicks contractions can happen more often and may become stronger. These contractions are sometimes difficult to tell apart from true labor because they can be very uncomfortable. You should not feel embarrassed if you go to the hospital with false labor. Sometimes, the only way to tell if you are in true labor is for your health care provider to look for changes in the cervix. The health care provider will do a physical exam and may monitor your contractions. If you are not in true labor, the exam should show that your cervix is not dilating and your water has not broken. If there are no other health problems associated with your pregnancy, it is completely safe for you to be sent home with false labor. You may continue to have Braxton Hicks contractions until you go into true labor. How to tell the difference between true labor and false labor True labor  Contractions last 30-70 seconds.  Contractions become very regular.  Discomfort is usually felt in the top of the uterus, and it spreads to the lower abdomen and low back.  Contractions do not go away with walking.  Contractions usually become more intense and increase in frequency.  The cervix dilates and gets thinner. False labor  Contractions are usually shorter and not as strong as true labor contractions.  Contractions are usually irregular.  Contractions are often felt in the front of the lower abdomen and in the groin.  Contractions may go away when you walk around or change positions while lying down.  Contractions get weaker and are shorter-lasting as time goes on.  The cervix usually does not dilate or become thin. Follow these instructions at home:  1. Take over-the-counter and prescription medicines only as told by your health care provider. 2. Keep up with your usual exercises and follow other  instructions from your health care provider. 3. Eat and drink lightly if you think you are going into labor. 4. If Braxton Hicks contractions are making you uncomfortable: ? Change your position from lying down or resting to walking, or change from walking to resting. ? Sit and rest in a tub of warm water. ? Drink enough fluid to keep your urine pale yellow. Dehydration may cause these contractions. ? Do slow and deep breathing several times an hour. 5. Keep all follow-up prenatal visits as told by your health care provider. This is important. Contact a health care provider if:  You have a fever.  You have continuous pain in your abdomen. Get help right away if:  Your contractions become stronger, more regular, and closer together.  You have fluid leaking or gushing from your vagina.  You pass blood-tinged mucus (bloody show).  You have bleeding from your vagina.  You have low back   pain that you never had before.  You feel your baby's head pushing down and causing pelvic pressure.  Your baby is not moving inside you as much as it used to. Summary  Contractions that occur before labor are called Braxton Hicks contractions, false labor, or practice contractions.  Braxton Hicks contractions are usually shorter, weaker, farther apart, and less regular than true labor contractions. True labor contractions usually become progressively stronger and regular, and they become more frequent.  Manage discomfort from [facility] contractions by changing position, resting in a warm bath, drinking plenty of water, or practicing deep breathing. This information is not intended to replace advice given to you by your health care provider. Make sure you discuss any questions you have with your health care provider. Document Revised: 02/12/2017 Document Reviewed: 07/16/2016 Elsevier Patient Education  [place].

## 2020-05-10 DIAGNOSIS — Z029 Encounter for administrative examinations, unspecified: Secondary | ICD-10-CM

## 2020-05-12 ENCOUNTER — Encounter (HOSPITAL_COMMUNITY): Payer: Self-pay | Admitting: Obstetrics & Gynecology

## 2020-05-12 ENCOUNTER — Other Ambulatory Visit: Payer: Self-pay

## 2020-05-12 ENCOUNTER — Inpatient Hospital Stay (EMERGENCY_DEPARTMENT_HOSPITAL)
Admission: AD | Admit: 2020-05-12 | Discharge: 2020-05-12 | Disposition: A | Discharge status: Home / Self Care | Payer: BC Managed Care – PPO | Source: Home / Self Care | Attending: Obstetrics & Gynecology | Admitting: Obstetrics & Gynecology

## 2020-05-12 ENCOUNTER — Inpatient Hospital Stay (HOSPITAL_COMMUNITY)
Admission: AD | Admit: 2020-05-12 | Discharge: 2020-05-14 | DRG: 807 | Disposition: A | Discharge status: Home / Self Care | Payer: BC Managed Care – PPO | Attending: Obstetrics & Gynecology | Admitting: Obstetrics & Gynecology

## 2020-05-12 ENCOUNTER — Inpatient Hospital Stay (HOSPITAL_COMMUNITY): Payer: BC Managed Care – PPO | Admitting: Anesthesiology

## 2020-05-12 DIAGNOSIS — Z20822 Contact with and (suspected) exposure to covid-19: Secondary | ICD-10-CM | POA: Diagnosis present

## 2020-05-12 DIAGNOSIS — O23593 Infection of other part of genital tract in pregnancy, third trimester: Secondary | ICD-10-CM | POA: Diagnosis not present

## 2020-05-12 DIAGNOSIS — F17210 Nicotine dependence, cigarettes, uncomplicated: Secondary | ICD-10-CM | POA: Insufficient documentation

## 2020-05-12 DIAGNOSIS — O874 Varicose veins of lower extremity in the puerperium: Secondary | ICD-10-CM | POA: Diagnosis present

## 2020-05-12 DIAGNOSIS — Z3490 Encounter for supervision of normal pregnancy, unspecified, unspecified trimester: Secondary | ICD-10-CM

## 2020-05-12 DIAGNOSIS — O99333 Smoking (tobacco) complicating pregnancy, third trimester: Secondary | ICD-10-CM | POA: Insufficient documentation

## 2020-05-12 DIAGNOSIS — O2200 Varicose veins of lower extremity in pregnancy, unspecified trimester: Secondary | ICD-10-CM | POA: Diagnosis present

## 2020-05-12 DIAGNOSIS — N760 Acute vaginitis: Secondary | ICD-10-CM

## 2020-05-12 DIAGNOSIS — O99891 Other specified diseases and conditions complicating pregnancy: Secondary | ICD-10-CM | POA: Diagnosis not present

## 2020-05-12 DIAGNOSIS — O26893 Other specified pregnancy related conditions, third trimester: Secondary | ICD-10-CM | POA: Diagnosis not present

## 2020-05-12 DIAGNOSIS — Z3A39 39 weeks gestation of pregnancy: Secondary | ICD-10-CM

## 2020-05-12 DIAGNOSIS — O99330 Smoking (tobacco) complicating pregnancy, unspecified trimester: Secondary | ICD-10-CM | POA: Diagnosis present

## 2020-05-12 DIAGNOSIS — O99334 Smoking (tobacco) complicating childbirth: Secondary | ICD-10-CM | POA: Diagnosis present

## 2020-05-12 DIAGNOSIS — B9689 Other specified bacterial agents as the cause of diseases classified elsewhere: Secondary | ICD-10-CM | POA: Diagnosis not present

## 2020-05-12 LAB — URINALYSIS, ROUTINE W REFLEX MICROSCOPIC
Bacteria, UA: NONE SEEN
Bilirubin Urine: NEGATIVE
Glucose, UA: NEGATIVE mg/dL
Ketones, ur: NEGATIVE mg/dL
Leukocytes,Ua: NEGATIVE
Nitrite: NEGATIVE
Protein, ur: NEGATIVE mg/dL
Specific Gravity, Urine: 1.003 — ABNORMAL LOW (ref 1.005–1.030)
pH: 7 (ref 5.0–8.0)

## 2020-05-12 LAB — TYPE AND SCREEN
ABO/RH(D): O POS
Antibody Screen: NEGATIVE

## 2020-05-12 LAB — WET PREP, GENITAL
Sperm: NONE SEEN
Trich, Wet Prep: NONE SEEN
Yeast Wet Prep HPF POC: NONE SEEN

## 2020-05-12 LAB — CBC
HCT: 44.8 % (ref 36.0–46.0)
Hemoglobin: 15.6 g/dL — ABNORMAL HIGH (ref 12.0–15.0)
MCH: 30.5 pg (ref 26.0–34.0)
MCHC: 34.8 g/dL (ref 30.0–36.0)
MCV: 87.7 fL (ref 80.0–100.0)
Platelets: 215 10*3/uL (ref 150–400)
RBC: 5.11 MIL/uL (ref 3.87–5.11)
RDW: 13.9 % (ref 11.5–15.5)
WBC: 26.9 10*3/uL — ABNORMAL HIGH (ref 4.0–10.5)
nRBC: 0 % (ref 0.0–0.2)

## 2020-05-12 LAB — RESP PANEL BY RT-PCR (FLU A&B, COVID) ARPGX2
Influenza A by PCR: NEGATIVE
Influenza B by PCR: NEGATIVE
SARS Coronavirus 2 by RT PCR: NEGATIVE

## 2020-05-12 MED ORDER — TRANEXAMIC ACID-NACL 1000-0.7 MG/100ML-% IV SOLN
INTRAVENOUS | Status: AC
  Administered 2020-05-12: 1000 mg
  Filled 2020-05-12: qty 100

## 2020-05-12 MED ORDER — MEDROXYPROGESTERONE ACETATE 150 MG/ML IM SUSP
150.0000 mg | Freq: Once | INTRAMUSCULAR | Status: DC
  Filled 2020-05-12: qty 1

## 2020-05-12 MED ORDER — SOD CITRATE-CITRIC ACID 500-334 MG/5ML PO SOLN: 30.0000 mL | ORAL | Status: DC | PRN

## 2020-05-12 MED ORDER — SENNOSIDES-DOCUSATE SODIUM 8.6-50 MG PO TABS
2.0000 | ORAL_TABLET | Freq: Every day | ORAL | Status: DC
  Administered 2020-05-13: 2 via ORAL
  Filled 2020-05-12 (×2): qty 2

## 2020-05-12 MED ORDER — OXYTOCIN-SODIUM CHLORIDE 30-0.9 UT/500ML-% IV SOLN
2.5000 [IU]/h | INTRAVENOUS | Status: DC
  Filled 2020-05-12: qty 500

## 2020-05-12 MED ORDER — PHENYLEPHRINE 40 MCG/ML (10ML) SYRINGE FOR IV PUSH (FOR BLOOD PRESSURE SUPPORT): 80.0000 ug | PREFILLED_SYRINGE | INTRAVENOUS | Status: DC | PRN

## 2020-05-12 MED ORDER — EPHEDRINE 5 MG/ML INJ: 10.0000 mg | INTRAVENOUS | Status: DC | PRN

## 2020-05-12 MED ORDER — FENTANYL CITRATE (PF) 100 MCG/2ML IJ SOLN
100.0000 ug | INTRAMUSCULAR | Status: DC | PRN
  Administered 2020-05-12: 100 ug via INTRAVENOUS
  Filled 2020-05-12: qty 2

## 2020-05-12 MED ORDER — LACTATED RINGERS IV SOLN
INTRAVENOUS | Status: DC
Start: 2020-05-12 — End: 2020-05-12

## 2020-05-12 MED ORDER — ERYTHROMYCIN 5 MG/GM OP OINT
TOPICAL_OINTMENT | OPHTHALMIC | Status: AC
  Filled 2020-05-12: qty 1

## 2020-05-12 MED ORDER — OXYTOCIN 10 UNIT/ML IJ SOLN: 10.0000 [IU] | Freq: Once | INTRAMUSCULAR | Status: DC

## 2020-05-12 MED ORDER — BENZOCAINE-MENTHOL 20-0.5 % EX AERO: 1.0000 "application " | INHALATION_SPRAY | CUTANEOUS | Status: DC | PRN

## 2020-05-12 MED ORDER — DIPHENHYDRAMINE HCL 25 MG PO CAPS
25.0000 mg | ORAL_CAPSULE | Freq: Four times a day (QID) | ORAL | Status: DC | PRN
  Administered 2020-05-13: 25 mg via ORAL
  Filled 2020-05-12: qty 1

## 2020-05-12 MED ORDER — ONDANSETRON HCL 4 MG/2ML IJ SOLN: 4.0000 mg | INTRAMUSCULAR | Status: DC | PRN

## 2020-05-12 MED ORDER — WITCH HAZEL-GLYCERIN EX PADS: 1.0000 "application " | MEDICATED_PAD | CUTANEOUS | Status: DC | PRN

## 2020-05-12 MED ORDER — FLEET ENEMA 7-19 GM/118ML RE ENEM: 1.0000 | ENEMA | RECTAL | Status: DC | PRN

## 2020-05-12 MED ORDER — ONDANSETRON HCL 4 MG PO TABS: 4.0000 mg | ORAL_TABLET | ORAL | Status: DC | PRN

## 2020-05-12 MED ORDER — SIMETHICONE 80 MG PO CHEW
80.0000 mg | CHEWABLE_TABLET | ORAL | Status: DC | PRN
  Administered 2020-05-13: 80 mg via ORAL
  Filled 2020-05-12: qty 1

## 2020-05-12 MED ORDER — OXYTOCIN BOLUS FROM INFUSION
333.0000 mL | Freq: Once | INTRAVENOUS | Status: AC
  Administered 2020-05-12: 333 mL via INTRAVENOUS

## 2020-05-12 MED ORDER — COCONUT OIL OIL: 1.0000 "application " | TOPICAL_OIL | Status: DC | PRN

## 2020-05-12 MED ORDER — PHENYLEPHRINE 40 MCG/ML (10ML) SYRINGE FOR IV PUSH (FOR BLOOD PRESSURE SUPPORT)
80.0000 ug | PREFILLED_SYRINGE | INTRAVENOUS | Status: DC | PRN
  Filled 2020-05-12: qty 10

## 2020-05-12 MED ORDER — ACETAMINOPHEN 325 MG PO TABS: 650.0000 mg | ORAL_TABLET | ORAL | Status: DC | PRN

## 2020-05-12 MED ORDER — ONDANSETRON HCL 4 MG/2ML IJ SOLN
INTRAMUSCULAR | Status: AC
  Filled 2020-05-12: qty 2

## 2020-05-12 MED ORDER — IBUPROFEN 600 MG PO TABS
600.0000 mg | ORAL_TABLET | Freq: Four times a day (QID) | ORAL | Status: DC
  Administered 2020-05-13 – 2020-05-14 (×5): 600 mg via ORAL
  Filled 2020-05-12 (×5): qty 1

## 2020-05-12 MED ORDER — LACTATED RINGERS IV SOLN: 500.0000 mL | INTRAVENOUS | Status: DC | PRN

## 2020-05-12 MED ORDER — TETANUS-DIPHTH-ACELL PERTUSSIS 5-2.5-18.5 LF-MCG/0.5 IM SUSY: 0.5000 mL | PREFILLED_SYRINGE | Freq: Once | INTRAMUSCULAR | Status: DC

## 2020-05-12 MED ORDER — ONDANSETRON HCL 4 MG/2ML IJ SOLN
4.0000 mg | Freq: Four times a day (QID) | INTRAMUSCULAR | Status: DC | PRN
  Administered 2020-05-12: 4 mg via INTRAVENOUS

## 2020-05-12 MED ORDER — LIDOCAINE HCL (PF) 1 % IJ SOLN
30.0000 mL | INTRAMUSCULAR | Status: AC | PRN
Start: 2020-05-12 — End: 2020-05-12
  Administered 2020-05-12: 30 mL via SUBCUTANEOUS
  Filled 2020-05-12: qty 30

## 2020-05-12 MED ORDER — FENTANYL-BUPIVACAINE-NACL 0.5-0.125-0.9 MG/250ML-% EP SOLN
12.0000 mL/h | EPIDURAL | Status: DC | PRN
  Administered 2020-05-12: 12 mL/h via EPIDURAL
  Filled 2020-05-12: qty 250

## 2020-05-12 MED ORDER — ACETAMINOPHEN 325 MG PO TABS
650.0000 mg | ORAL_TABLET | Freq: Four times a day (QID) | ORAL | Status: DC
  Administered 2020-05-13 – 2020-05-14 (×5): 650 mg via ORAL
  Filled 2020-05-12 (×5): qty 2

## 2020-05-12 MED ORDER — DIPHENHYDRAMINE HCL 50 MG/ML IJ SOLN: 12.5000 mg | INTRAMUSCULAR | Status: DC | PRN

## 2020-05-12 MED ORDER — LIDOCAINE HCL (PF) 1 % IJ SOLN
INTRAMUSCULAR | Status: DC | PRN
  Administered 2020-05-12: 10 mL via EPIDURAL

## 2020-05-12 MED ORDER — DIBUCAINE (PERIANAL) 1 % EX OINT
1.0000 | TOPICAL_OINTMENT | CUTANEOUS | Status: DC | PRN
Start: 2020-05-12 — End: 2020-05-14

## 2020-05-12 MED ORDER — PRENATAL MULTIVITAMIN CH
1.0000 | ORAL_TABLET | Freq: Every day | ORAL | Status: DC
  Administered 2020-05-13: 1 via ORAL
  Filled 2020-05-12: qty 1

## 2020-05-12 MED ORDER — LACTATED RINGERS IV SOLN: 500.0000 mL | Freq: Once | INTRAVENOUS | Status: DC

## 2020-05-12 MED ORDER — OXYTOCIN 10 UNIT/ML IJ SOLN
INTRAMUSCULAR | Status: AC
  Filled 2020-05-12: qty 1

## 2020-05-12 MED ORDER — METRONIDAZOLE 500 MG PO TABS: 500.0000 mg | ORAL_TABLET | Freq: Two times a day (BID) | ORAL | 0 refills | Status: DC

## 2020-05-12 NOTE — MAU Note (Signed)
Patient here for bloody discharge and passing 2 clots.  Also reports an abnormal odor and some mild burning.  Denies any frank VB.  + FM.  Denies CTX.

## 2020-05-12 NOTE — Discharge Instructions (Signed)
Safe Medications in Pregnancy   Acne: Benzoyl Peroxide Salicylic Acid  Backache/Headache: Tylenol: 2 regular strength every 4 hours OR              2 Extra strength every 6 hours  Colds/Coughs/Allergies: Benadryl (alcohol free) 25 mg every 6 hours as needed Breath right strips Claritin Cepacol throat lozenges Chloraseptic throat spray Cold-Eeze- up to three times per day Cough drops, alcohol free Flonase (by prescription only) Guaifenesin Mucinex Robitussin DM (plain only, alcohol free) Saline nasal spray/drops Sudafed (pseudoephedrine) & Actifed ** use only after [redacted] weeks gestation and if you do not have high blood pressure Tylenol Vicks Vaporub Zinc lozenges Zyrtec   Constipation: Colace Ducolax suppositories Fleet enema Glycerin suppositories Metamucil Milk of magnesia Miralax Senokot Smooth move tea  Diarrhea: Kaopectate Imodium A-D  *NO pepto Bismol  Hemorrhoids: Anusol Anusol HC Preparation H Tucks  Indigestion: Tums Maalox Mylanta Zantac  Pepcid  Insomnia: Benadryl (alcohol free) 25mg  every 6 hours as needed Tylenol PM Unisom, no Gelcaps  Leg Cramps: Tums MagGel  Nausea/Vomiting:  Bonine Dramamine Emetrol Ginger extract Sea bands Meclizine  Nausea medication to take during pregnancy:  Unisom (doxylamine succinate 25 mg tablets) Take one tablet daily at bedtime. If symptoms are not adequately controlled, the dose can be increased to a maximum recommended dose of two tablets daily (1/2 tablet in the morning, 1/2 tablet mid-afternoon and one at bedtime). Vitamin B6 100mg  tablets. Take one tablet twice a day (up to 200 mg per day).  Skin Rashes: Aveeno products Benadryl cream or 25mg  every 6 hours as needed Calamine Lotion 1% cortisone cream  Yeast infection: Gyne-lotrimin 7 Monistat 7   **If taking multiple medications, please check labels to avoid duplicating the same active ingredients **take medication as directed on  the label ** Do not exceed 4000 mg of tylenol in 24 hours **Do not take medications that contain aspirin or ibuprofen     Bacterial Vaginosis  Bacterial vaginosis is an infection that occurs when the normal balance of bacteria in the vagina changes. This change is caused by an overgrowth of certain bacteria in the vagina. Bacterial vaginosis is the most common vaginal infection among females aged 59 to 65 years. This condition increases the risk of sexually transmitted infections (STIs). Treatment can help reduce this risk. Treatment is very important for pregnant women because this condition can cause babies to be born early (prematurely) or at a low birth weight. What are the causes? This condition is caused by an increase in harmful bacteria that are normally present in small amounts in the vagina. However, the exact reason this condition develops is not known. You cannot get bacterial vaginosis from toilet seats, bedding, swimming pools, or contact with objects around you. What increases the risk? The following factors may make you more likely to develop this condition:  Having a new sexual partner or multiple sexual partners, or having unprotected sex.  Douching.  Having an intrauterine device (IUD).  Smoking.  Abusing drugs and alcohol. This may lead to riskier sexual behavior.  Taking certain antibiotic medicines.  Being pregnant. What are the signs or symptoms? Some women with this condition have no symptoms. Symptoms may include:  or white vaginal discharge. The discharge can be watery or foamy.  A fish-like odor with discharge, especially after sex or during menstruation.  Itching in and around the vagina.  Burning or pain with urination. How is this diagnosed? This condition is diagnosed based on:  Your medical history.  A physical exam of the vagina.  Checking a sample of vaginal fluid for harmful bacteria or abnormal cells. How is this treated? This  condition is treated with antibiotic medicines. These may be given as a pill, a vaginal cream, or a medicine that is put into the vagina (suppository). If the condition comes back after treatment, a second round of antibiotics may be needed. Follow these instructions at home: Medicines  Take or apply over-the-counter and prescription medicines only as told by your health care provider.  Take or apply your antibiotic medicine as told by your health care provider. Do not stop using the antibiotic even if you start to feel better. General instructions  If you have a female sexual partner, tell her that you have a vaginal infection. She should follow up with her health care provider. If you have a female sexual partner, he does not need treatment.  Avoid sexual activity until you finish treatment.  Drink enough fluid to keep your urine pale yellow.  Keep the area around your vagina and rectum clean. ? Wash the area daily with warm water. ? Wipe yourself from front to back after using the toilet.  If you are breastfeeding, talk to your health care provider about continuing breastfeeding during treatment.  Keep all follow-up visits. This is important. How is this prevented? Self-care  Do not douche.  Wash the outside of your vagina with warm water only.  Wear cotton or cotton-lined underwear.  Avoid wearing tight pants and pantyhose, especially during the summer. Safe sex  Use protection when having sex. This includes: ? Using condoms. ? Using dental dams. This is a thin layer of a material made of latex or polyurethane that protects the mouth during oral sex.  Limit the number of sexual partners. To help prevent bacterial vaginosis, it is best to have sex with just one partner (monogamous relationship).  Make sure you and your sexual partner are tested for STIs. Drugs and alcohol  Do not use any products that contain nicotine or tobacco. These products include cigarettes, chewing  tobacco, and vaping devices, such as e-cigarettes. If you need help quitting, ask your health care provider.  Do not use drugs.  Do not drink alcohol if: ? Your health care provider tells you not to do this. ? You are pregnant, may be pregnant, or are planning to become pregnant.  If you drink alcohol: ? Limit how much you have to 0-1 drink a day. ? Be aware of how much alcohol is in your drink. In the [place] one drink equals one 12 oz bottle of beer (355 mL), one 5 oz glass of wine (148 mL), or one 1 oz glass of hard liquor (44 mL). Where to find more information  Centers for Disease Control and Prevention: FootballExhibition.com.br  American Sexual Health Association (ASHA): www.ashastd.org  [place] Department of Health and Health and safety inspector, Office on Women's Health: http://hoffman.com/ Contact a health care provider if:  Your symptoms do not improve, even after treatment.  You have more discharge or pain when urinating.  You have a fever or chills.  You have pain in your abdomen or pelvis.  You have pain during sex.  You have vaginal bleeding between menstrual periods. Summary  Bacterial vaginosis is a vaginal infection that occurs when the normal balance of bacteria in the vagina changes. It results from an overgrowth of certain bacteria.  This condition increases the risk of sexually transmitted infections (STIs). Getting treated can help reduce this risk.  Treatment is very important for pregnant women because this condition can cause babies to be born early (prematurely) or at low birth weight.  This condition is treated with antibiotic medicines. These may be given as a pill, a vaginal cream, or a medicine that is put into the vagina (suppository). This information is not intended to replace advice given to you by your health care provider. Make sure you discuss any questions you have with your health care provider. Document Revised: 08/31/2019 Document Reviewed: 08/31/2019 Elsevier  Patient Education  2021 [employer].

## 2020-05-12 NOTE — Anesthesia Procedure Notes (Signed)
Epidural Patient location during procedure: OB Start time: 05/12/2020 8:57 PM End time: 05/12/2020 9:02 PM  Staffing Anesthesiologist: Leilani Able, MD  Preanesthetic Checklist Completed: patient identified, IV checked, site marked, risks and benefits discussed, surgical consent, monitors and equipment checked, pre-op evaluation and timeout performed  Epidural Patient position: sitting Prep: DuraPrep and site prepped and draped Patient monitoring: continuous pulse ox and blood pressure Approach: midline Location: L3-L4 Injection technique: LOR air  Needle:  Needle type: Tuohy  Needle gauge: 17 G Needle length: 9 cm and 9 Needle insertion depth: 7 cm Catheter type: closed end flexible Catheter size: 19 Gauge Catheter at skin depth: 12 cm Test dose: negative and Other  Assessment Events: blood not aspirated, injection not painful, no injection resistance, no paresthesia and negative IV test  Additional Notes Reason for block:procedure for pain

## 2020-05-12 NOTE — H&P (Signed)
Obstetric History and Physical  Tracy Choi is a 35 y.o. G2P1001 with IUP at [redacted]w[redacted]d presenting in active labor.  Evaluated earlier today, discharged at 2 cm cervical dilatation.  Patient states she has been having  regular, every 5 minutes contractions, minimal vaginal bleeding, intact membranes, with active fetal movement.    Prenatal Course Source of Care: [facility] Pregnancy complications or risks: Patient Active Problem List   Diagnosis Date Noted  . Active labor at term 05/12/2020  . Varicose & spider veins during pregnancy, antepartum 02/22/2020  . Supervision of normal pregnancy 11/13/2019  . Tobacco smoking affecting pregnancy 03/05/2015    FAMILY TREE  LAB RESULTS  Language English Pap 05/12/19: neg  Initiated care at 13wks GC/CT Initial:   -/-         36wks:  Dating by 9wk u/s    Support person  Genetics NT/IT: neg    AFP:       Panorama:Low Risk Female     Carrier Screen neg  Flu vaccine DECLINED [place]/Hgb Elec   TDaP vaccine 12/9    Rhogam n/a Blood Type O/Positive/-- (08/30 1032)    Antibody Negative (08/30 1032)  Anatomy US Normal female '[place]' HBsAg Negative (08/30 1032)  Feeding Plan breast RPR Non Reactive (08/30 1032)  Contraception Depo in hosp, then interval BTL Rubella  6.27 (08/30 1032)  Circumcision Yes @ [facility] HIV Non Reactive (08/30 1032)  Pediatrician [place] Hep C neg  Prenatal Classes discussed      A1C/GTT Early:      26-28wks: normal  BTL Consent 05/06/20     VBAC Consent n/a GBS        negative   Prenatal Transfer Tool  Maternal Diabetes: No Genetic Screening: Normal Maternal Ultrasounds/Referrals: Normal Fetal Ultrasounds or other Referrals:  None Maternal Substance Abuse:  Yes:  Type: Smoker Significant Maternal Medications:  None Significant Maternal Lab Results: Group B Strep negative  Past Medical History:  Diagnosis Date  . Abnormal Pap smear   . Anxiety   . Asthma   . Back pain 02/14/2013  . Bronchitis   . Depression   . GERD  (gastroesophageal reflux disease)   . Herpes    do not let any family members know of the HSV  . Menorrhagia 02/28/2013  . Reflux 02/28/2013    Past Surgical History:  Procedure Laterality Date  . WISDOM TOOTH EXTRACTION      OB History  Gravida Para Term Preterm AB Living  2 1 1  0 0 1  SAB IAB Ectopic Multiple Live Births  0 0 0 0 1    # Outcome Date GA Lbr Len/2nd Weight Sex Delivery Anes PTL Lv  2 Current           1 Term 08/27/11 [redacted]w[redacted]d 09:01 / 00:27 3340 g M Vag-Spont EPI  LIV     Birth Comments: molding, but within normal limits    Social History   Socioeconomic History  . Marital status: Significant Other    Spouse name: Not on file  . Number of children: Not on file  . Years of education: Not on file  . Highest education level: Not on file  Occupational History  . Not on file  Tobacco Use  . Smoking status: Current Every Day Smoker    Packs/day: 0.50    Years: 15.00    Pack years: 7.50    Types: Cigarettes  . Smokeless tobacco: Former [redacted]w[redacted]d    Types: Neurosurgeon  .  Vaping Use: Former  Substance and Sexual Activity  . Alcohol use: No  . Drug use: Not Currently    Types: Marijuana    Comment: last used 04/17/17  . Sexual activity: Yes    Birth control/protection: None  Other Topics Concern  . Not on file  Social History Narrative  . Not on file   Social Determinants of Health   Financial Resource Strain: Medium Risk  . Difficulty of Paying Living Expenses: Somewhat hard  Food Insecurity: No Food Insecurity  . Worried About Programme researcher, broadcasting/film/video in the Last Year: Never true  . Ran Out of Food in the Last Year: Never true  Transportation Needs: No Transportation Needs  . Lack of Transportation (Medical): No  . Lack of Transportation (Non-Medical): No  Physical Activity: Insufficiently Active  . Days of Exercise per Week: 4 days  . Minutes of Exercise per Session: 20 min  Stress: Stress Concern Present  . Feeling of Stress : To some extent   Social Connections: Socially Isolated  . Frequency of Communication with Friends and Family: Once a week  . Frequency of Social Gatherings with Friends and Family: Once a week  . Attends Religious Services: Never  . Active Member of Clubs or Organizations: No  . Attends Banker Meetings: Never  . Marital Status: Never married    Family History  Problem Relation Age of Onset  . Cancer Mother   . Alcohol abuse Mother   . Hypertension Mother   . Breast cancer Mother 64  . Hypertension Maternal Aunt   . Heart disease Maternal Aunt   . Diabetes Maternal Uncle   . Heart disease Maternal Uncle   . Hypertension Maternal Uncle   . Hypertension Paternal Aunt   . Hypertension Paternal Uncle   . Heart attack Paternal Uncle        has stents  . Heart disease Maternal Grandmother   . Hypertension Maternal Grandmother   . Stroke Maternal Grandmother   . Breast cancer Maternal Grandmother 60  . Heart disease Maternal Grandfather   . Hypertension Maternal Grandfather   . Stroke Maternal Grandfather   . Cancer Paternal Grandmother        leukemia  . Hypertension Paternal Grandmother   . Hypertension Paternal Grandfather   . Heart murmur Son   . Anesthesia problems Neg Hx     Medications Prior to Admission  Medication Sig Dispense Refill Last Dose  . acetaminophen (TYLENOL) 325 MG tablet Take 650 mg by mouth daily as needed. For pain     . acyclovir (ZOVIRAX) 400 MG tablet Take 400 mg by mouth 2 (two) times daily. Pt taking once a day     . albuterol (PROVENTIL) (2.5 MG/3ML) 0.083% nebulizer solution Take 2.5 mg by nebulization as needed for wheezing or shortness of breath.     Marland Kitchen albuterol (VENTOLIN HFA) 108 (90 Base) MCG/ACT inhaler Inhale 2 puffs into the lungs every 6 (six) hours as needed. For asthmatic bronchitis     . Blood Pressure Monitoring (BLOOD PRESSURE MONITOR AUTOMAT) DEVI Take BP at home daily.  Alert [place] if >140/90 more than once. (Patient taking differently:  Take BP at home daily.  Alert [place] if >140/90 more than once.) 1 each 0   . diphenhydrAMINE (BENADRYL) 50 MG capsule Take 50 mg by mouth every 6 (six) hours as needed.     . metroNIDAZOLE (FLAGYL) 500 MG tablet Take 1 tablet (500 mg total) by mouth 2 (two) times  daily. 14 tablet 0   . potassium chloride (KLOR-CON) 10 MEQ tablet Take 1 tablet (10 mEq total) by mouth daily. (Patient not taking: Reported on 05/06/2020) 5 tablet 0   . prenatal vitamin w/FE, FA (PRENATAL 1 + 1) 27-1 MG TABS tablet Take 1 tablet by mouth daily at 12 noon. 30 tablet 11     Allergies  Allergen Reactions  . Peanut-Containing Drug Products Anaphylaxis  . Vicodin [Hydrocodone-Acetaminophen] Nausea And Vomiting    Patient states that she gets sick to her stomach when medicine is "getting out of her system"    Review of Systems: Negative except for what is mentioned in HPI.  Physical Exam: BP 97/68 (BP Location: Right Arm)   Pulse 78   Resp 18   Wt 69.8 kg   LMP  (LMP Unknown)   SpO2 100%   BMI 25.59 kg/m  CONSTITUTIONAL: Well-developed, well-nourished female in no acute distress.  HENT:  Normocephalic, atraumatic, External right and left ear normal.  EYES: Conjunctivae and EOM are normal. Pupils are equal, round, and reactive to light. No scleral icterus.  NECK: Normal range of motion, supple, no masses SKIN: Skin is warm and dry. No rash noted. Not diaphoretic. No erythema. No pallor. NEUROLOGIC: Alert and oriented to person, place, and time. Normal reflexes, muscle tone coordination. No cranial nerve deficit noted. PSYCHIATRIC: Normal mood and affect. Normal behavior. Normal judgment and thought content. CARDIOVASCULAR: Normal heart rate noted, regular rhythm RESPIRATORY: Effort and breath sounds normal, no problems with respiration noted ABDOMEN: Soft, nontender, nondistended, gravid. MUSCULOSKELETAL: Normal range of motion. No edema and no tenderness. 2+ distal pulses.  Cervical Exam: Dilation:  8 Effacement (%): 90 Station: 0,-1 Presentation: Vertex Exam by:: [employer] RN Presentation: Cephalic FHT:  Baseline rate 125 bpm   Variability moderate  Accelerations present   Decelerations none Contractions: Every 1-3 mins   Pertinent Labs/Studies:   Results for orders placed or performed during the hospital encounter of 05/12/20 (from the past 24 hour(s))  Type and screen [facility]     Status: None (Preliminary result)   Collection Time: 05/12/20  7:40 PM  Result Value Ref Range   ABO/RH(D) PENDING    Antibody Screen PENDING    Sample Expiration      05/15/2020,2359 Performed at [facility], [address], [place], [place] [address]     Assessment : Tracy Choi is a 35 y.o. G2P1001 at [redacted]w[redacted]d being admitted for active labor.  Plan: Labor: Expectant management for now.  Augmentation as needed. Analgesia as needed. FWB: Reassuring fetal heart tracing.  GBS negative. Delivery plan: Hopeful for vaginal delivery   Jaynie Collins, MD, FACOG Obstetrician & Gynecologist, [facility] for [employer], [facility]

## 2020-05-12 NOTE — MAU Provider Note (Signed)
History     CSN: [id number]  Arrival date and time: 05/12/20 0730   Event Date/Time   First Provider Initiated Contact with Patient 05/12/20 [phone]      Chief Complaint  Patient presents with  . Abdominal Pain  . Vaginal Discharge   HPI Tracy Choi is a 35 y.o. G2P1001 at [redacted]w[redacted]d who presents with vaginal discharge and contractions. She has an increase in grey discharge with a foul odor. She also reports occasional uc's and is concerned about labor. She denies any bleeding. She reports normal fetal movement.   OB History    Gravida  2   Para  1   Term  1   Preterm  0   AB  0   Living  1     SAB  0   IAB  0   Ectopic  0   Multiple  0   Live Births  1           Past Medical History:  Diagnosis Date  . Abnormal Pap smear   . Anxiety   . Asthma   . Bronchitis   . Depression   . GERD (gastroesophageal reflux disease)   . Herpes    do not let any family members know of the HSV    Past Surgical History:  Procedure Laterality Date  . WISDOM TOOTH EXTRACTION      Family History  Problem Relation Age of Onset  . Cancer Mother   . Alcohol abuse Mother   . Hypertension Mother   . Breast cancer Mother 40  . Hypertension Maternal Aunt   . Heart disease Maternal Aunt   . Diabetes Maternal Uncle   . Heart disease Maternal Uncle   . Hypertension Maternal Uncle   . Hypertension Paternal Aunt   . Hypertension Paternal Uncle   . Heart attack Paternal Uncle        has stents  . Heart disease Maternal Grandmother   . Hypertension Maternal Grandmother   . Stroke Maternal Grandmother   . Breast cancer Maternal Grandmother 60  . Heart disease Maternal Grandfather   . Hypertension Maternal Grandfather   . Stroke Maternal Grandfather   . Cancer Paternal Grandmother        leukemia  . Hypertension Paternal Grandmother   . Hypertension Paternal Grandfather   . Heart murmur Son   . Anesthesia problems Neg Hx     Social History   Tobacco Use  .  Smoking status: Current Every Day Smoker    Packs/day: 0.50    Years: 15.00    Pack years: 7.50    Types: Cigarettes  . Smokeless tobacco: Former Neurosurgeon    Types: Engineer, drilling  . Vaping Use: Former  Substance Use Topics  . Alcohol use: No  . Drug use: Not Currently    Types: Marijuana    Comment: last used 04/17/17    Allergies:  Allergies  Allergen Reactions  . Peanut-Containing Drug Products Anaphylaxis  . Vicodin [Hydrocodone-Acetaminophen] Nausea And Vomiting    Patient states that she gets sick to her stomach when medicine is "getting out of her system"    No medications prior to admission.    Review of Systems  Constitutional: Negative.  Negative for fatigue and fever.  HENT: Negative.   Respiratory: Negative.  Negative for shortness of breath.   Cardiovascular: Negative.  Negative for chest pain.  Gastrointestinal: Negative.  Negative for abdominal pain, constipation, diarrhea, nausea and vomiting.  Genitourinary:  Positive for vaginal discharge. Negative for dysuria.  Neurological: Negative.  Negative for dizziness and headaches.   Physical Exam   Blood pressure 128/78, pulse 87, temperature 98.1 F (36.7 C), resp. rate 16, weight 70 kg.  Physical Exam Vitals and nursing note reviewed.  Constitutional:      General: She is not in acute distress.    Appearance: She is well-developed and well-nourished.  HENT:     Head: Normocephalic.  Eyes:     Pupils: Pupils are equal, round, and reactive to light.  Cardiovascular:     Rate and Rhythm: Normal rate and regular rhythm.     Heart sounds: Normal heart sounds.  Pulmonary:     Effort: Pulmonary effort is normal. No respiratory distress.     Breath sounds: Normal breath sounds.  Abdominal:     General: Bowel sounds are normal. There is no distension.     Palpations: Abdomen is soft.     Tenderness: There is no abdominal tenderness.  Skin:    General: Skin is warm and dry.  Neurological:     Mental  Status: She is alert and oriented to person, place, and time.  Psychiatric:        Mood and Affect: Mood and affect normal.        Behavior: Behavior normal.        Thought Content: Thought content normal.        Judgment: Judgment normal.    Fetal Tracing:  Baseline: 120 Variability: moderate Accels: 15x15 Decels: none  Toco: occasional uc's  Dilation: 2 Exam by:: Druscilla Brownie, [place] CNM   MAU Course  Procedures Results for orders placed or performed during the hospital encounter of 05/12/20 (from the past 24 hour(s))  Wet prep, genital     Status: Abnormal   Collection Time: 05/12/20  8:50 AM  Result Value Ref Range   Yeast Wet Prep HPF POC NONE SEEN NONE SEEN   Trich, Wet Prep NONE SEEN NONE SEEN   Clue Cells Wet Prep HPF POC PRESENT (A) NONE SEEN   WBC, Wet Prep HPF POC MANY (A) NONE SEEN   Sperm NONE SEEN   Urinalysis, Routine w reflex microscopic Urine, Clean Catch     Status: Abnormal   Collection Time: 05/12/20  9:33 AM  Result Value Ref Range   Color, Urine YELLOW YELLOW   APPearance CLEAR CLEAR   Specific Gravity, Urine 1.003 (L) 1.005 - 1.030   pH 7.0 5.0 - 8.0   Glucose, UA NEGATIVE NEGATIVE mg/dL   Hgb urine dipstick SMALL (A) NEGATIVE   Bilirubin Urine NEGATIVE NEGATIVE   Ketones, ur NEGATIVE NEGATIVE mg/dL   Protein, ur NEGATIVE NEGATIVE mg/dL   Nitrite NEGATIVE NEGATIVE   Leukocytes,Ua NEGATIVE NEGATIVE   RBC / HPF 0-5 0 - 5 RBC/hpf   WBC, UA 0-5 0 - 5 WBC/hpf   Bacteria, UA NONE SEEN NONE SEEN   Squamous Epithelial / LPF 0-5 0 - 5   MDM UA Wet prep and gc/chlamydia Cervix unchanged after 2 hours  Assessment and Plan   1. Bacterial vaginosis   2. [redacted] weeks gestation of pregnancy    -Discharge home in stable condition -Rx for metronidazole sent to patient's pharmacy -Labor precautions discussed -Patient advised to follow-up with OB as scheduled for prenatal care -Patient may return to MAU as needed or if her condition were to change or  worsen   Rolm Bookbinder CNM 05/12/2020, 2:04 PM

## 2020-05-12 NOTE — Anesthesia Preprocedure Evaluation (Signed)
Anesthesia Evaluation  Patient identified by MRN, date of birth, ID band Patient awake    Reviewed: Allergy & Precautions, H&P , NPO status , Patient's Chart, lab work & pertinent test results  Airway Mallampati: I  TM Distance: >3 FB Neck ROM: full    Dental no notable dental hx.    Pulmonary Current Smoker,    Pulmonary exam normal breath sounds clear to auscultation       Cardiovascular negative cardio ROS Normal cardiovascular exam Rhythm:regular Rate:Normal     Neuro/Psych negative neurological ROS     GI/Hepatic Neg liver ROS,   Endo/Other  negative endocrine ROS  Renal/GU negative Renal ROS     Musculoskeletal negative musculoskeletal ROS (+)   Abdominal Normal abdominal exam  (+)   Peds  Hematology negative hematology ROS (+)   Anesthesia Other Findings   Reproductive/Obstetrics (+) Pregnancy                             Anesthesia Physical Anesthesia Plan  ASA: II  Anesthesia Plan: Epidural   Post-op Pain Management:    Induction:   PONV Risk Score and Plan:   Airway Management Planned:   Additional Equipment:   Intra-op Plan:   Post-operative Plan:   Informed Consent: I have reviewed the patients History and Physical, chart, labs and discussed the procedure including the risks, benefits and alternatives for the proposed anesthesia with the patient or authorized representative who has indicated his/her understanding and acceptance.       Plan Discussed with:   Anesthesia Plan Comments:         Anesthesia Quick Evaluation

## 2020-05-12 NOTE — MAU Note (Addendum)
Pt presents to MAU with c/o contractions that has picked up in last hour that are every 5 minutes apart.  +FM

## 2020-05-12 NOTE — Lactation Note (Signed)
This note was copied from a baby's chart. Lactation Consultation Note  Patient Name: Tracy Choi LPNPY'Y Date: 05/12/2020   Age: <1 hr  LC talked to RN, Joella Prince but they were in the middle of a procedure and it was not a good time to assist Mom with breastfeeding.  LC checked in a second time and Mom was eating and wanted to be seen by Lactation on the floor.   Maternal Data    Feeding    LATCH Score                    Lactation Tools Discussed/Used    Interventions    Discharge    Consult Status      Daviyon Widmayer  Nicholson-Springer 05/12/2020, 10:58 PM

## 2020-05-13 LAB — CBC
HCT: 37.1 % (ref 36.0–46.0)
Hemoglobin: 12.4 g/dL (ref 12.0–15.0)
MCH: 29.9 pg (ref 26.0–34.0)
MCHC: 33.4 g/dL (ref 30.0–36.0)
MCV: 89.4 fL (ref 80.0–100.0)
Platelets: 182 10*3/uL (ref 150–400)
RBC: 4.15 MIL/uL (ref 3.87–5.11)
RDW: 13.7 % (ref 11.5–15.5)
WBC: 22.8 10*3/uL — ABNORMAL HIGH (ref 4.0–10.5)
nRBC: 0 % (ref 0.0–0.2)

## 2020-05-13 LAB — GC/CHLAMYDIA PROBE AMP (~~LOC~~) NOT AT ARMC
Chlamydia: NEGATIVE
Comment: NEGATIVE
Comment: NORMAL
Neisseria Gonorrhea: NEGATIVE

## 2020-05-13 LAB — RPR: RPR Ser Ql: NONREACTIVE

## 2020-05-13 MED ORDER — HYDROXYZINE HCL 25 MG PO TABS
25.0000 mg | ORAL_TABLET | Freq: Three times a day (TID) | ORAL | Status: DC | PRN
  Administered 2020-05-13 (×2): 25 mg via ORAL
  Filled 2020-05-13 (×2): qty 1

## 2020-05-13 NOTE — Progress Notes (Signed)
POSTPARTUM PROGRESS NOTE  Subjective: Tracy Choi is a 35 y.o. [id number] s/p vaginal delivery at [redacted]w[redacted]d.  She reports she doing well. No acute events overnight. She denies any problems with ambulating, voiding or po intake. Denies nausea or vomiting. She has  passed flatus. Pain is well controlled.  Lochia is minimal.  Objective: Blood pressure 104/69, pulse 89, temperature 98.9 F (37.2 C), temperature source Oral, resp. rate 18, weight 69.8 kg, SpO2 96 %, unknown if currently breastfeeding.  Physical Exam:  General: alert, cooperative and no distress Chest: no respiratory distress Abdomen: soft, non-tender  Uterine Fundus: firm and at level of umbilicus Extremities: No calf swelling or tenderness  no LE edema  extensive varicose veins  Recent Labs    05/12/20 1940 05/13/20 0540  HGB 15.6* 12.4  HCT 44.8 37.1    Assessment/Plan: Tracy Choi is a 35 y.o. [id number] s/p vaginal delivery at [redacted]w[redacted]d.  Routine Postpartum Care: Doing well, pain well-controlled.  -- Continue routine care, lactation support  -- Contraception: desires Depo prior to discharge, then interval BTL -- Feeding: breast  Dispo: Plan for discharge PPD#2.  Sheila Oats, MD OB Fellow, Faculty Practice 05/13/2020 9:08 AM

## 2020-05-13 NOTE — Discharge Summary (Signed)
Postpartum Discharge Summary    Patient Name: Tracy Choi DOB: 03/13/1986 MRN: [id number]  Date of admission: 05/12/2020 Delivery date:05/12/2020  Delivering provider: Randa Ngo  Date of discharge: 05/14/2020  Admitting diagnosis: Labor and delivery indication for care or intervention [O75.9] Intrauterine pregnancy: [redacted]w[redacted]d     Secondary diagnosis:  Principal Problem:   Active labor at term Active Problems:   Tobacco smoking affecting pregnancy   Supervision of normal pregnancy   Varicose & spider veins during pregnancy, antepartum   Vaginal delivery  Additional problems: as noted above    Discharge diagnosis: Term Pregnancy Delivered                                              Post partum procedures:depo Augmentation: AROM (immediately prior to delivery) Complications: None  Hospital course: Onset of Labor With Vaginal Delivery      35 y.o. yo [id number] at [redacted]w[redacted]d was admitted in Active Labor on 05/12/2020. Patient had an uncomplicated labor course as follows:  Membrane Rupture Time/Date: 9:18 PM ,05/12/2020   Delivery Method:Vaginal, Spontaneous  Episiotomy:   Lacerations:  Periurethral;1st degree  Patient had an uncomplicated postpartum course.  She is ambulating, tolerating a regular diet, passing flatus, and urinating well. Patient is discharged home in stable condition on 05/14/20.  Newborn Data: Birth date:05/12/2020  Birth time:9:25 PM  Gender:Female  Living status:Living  Apgars:6 ,8  Weight:3016 g   Magnesium Sulfate received: No BMZ received: No Rhophylac:N/A MMR:N/A T-DaP:Given prenatally Flu: offered prior to discharge Transfusion:No  Physical exam  Vitals:   05/13/20 1125 05/13/20 1443 05/13/20 2319 05/14/20 0559  BP: 110/70 110/69 101/61 105/71  Pulse: 78 80 71 66  Resp: $Remo'16 16 18 18  'xTeIj$ Temp: 98.2 F (36.8 C) 99 F (37.2 C) 98.2 F (36.8 C) 97.9 F (36.6 C)  TempSrc: Oral Oral Oral Oral  SpO2: 95% 98%  98%  Weight:       General: alert,  cooperative and no distress Lochia: appropriate Uterine Fundus: firm Incision: N/A DVT Evaluation: No evidence of DVT seen on physical exam. No cords or calf tenderness. No significant calf/ankle edema. Labs: Lab Results  Component Value Date   WBC 22.8 (H) 05/13/2020   HGB 12.4 05/13/2020   HCT 37.1 05/13/2020   MCV 89.4 05/13/2020   PLT 182 05/13/2020   CMP Latest Ref Rng & Units 04/09/2020  Glucose 70 - 99 mg/dL 100(H)  BUN 6 - 20 mg/dL <5(L)  Creatinine 0.44 - 1.00 mg/dL 0.47  Sodium 135 - 145 mmol/L 139  Potassium 3.5 - 5.1 mmol/L 2.9(L)  Chloride 98 - 111 mmol/L 108  CO2 22 - 32 mmol/L 23  Calcium 8.9 - 10.3 mg/dL 9.1  Total Protein 6.5 - 8.1 g/dL 4.5(L)  Total Bilirubin 0.3 - 1.2 mg/dL 0.6  Alkaline Phos 38 - 126 U/L 80  AST 15 - 41 U/L 16  ALT 0 - 44 U/L 18   Edinburgh Score: No flowsheet data found.   After visit meds:  Allergies as of 05/14/2020      Reactions   Peanut-containing Drug Products Anaphylaxis   Vicodin [hydrocodone-acetaminophen] Nausea And Vomiting   Patient states that she gets sick to her stomach when medicine is "getting out of her system"      Medication List    STOP taking these medications   Blood Pressure Monitor  Automat Devi   potassium chloride 10 MEQ tablet Commonly known as: KLOR-CON     TAKE these medications   acetaminophen 325 MG tablet Commonly known as: TYLENOL Take 650 mg by mouth daily as needed. For pain   acyclovir 400 MG tablet Commonly known as: ZOVIRAX Take 400 mg by mouth 2 (two) times daily. Pt taking once a day   albuterol 108 (90 Base) MCG/ACT inhaler Commonly known as: VENTOLIN HFA Inhale 2 puffs into the lungs every 6 (six) hours as needed. For asthmatic bronchitis   albuterol (2.5 MG/3ML) 0.083% nebulizer solution Commonly known as: PROVENTIL Take 2.5 mg by nebulization as needed for wheezing or shortness of breath.   coconut oil Oil Apply 1 application topically as needed (nipple pain).    diphenhydrAMINE 50 MG capsule Commonly known as: BENADRYL Take 50 mg by mouth every 6 (six) hours as needed.   ibuprofen 600 MG tablet Commonly known as: ADVIL Take 1 tablet (600 mg total) by mouth every 8 (eight) hours as needed for moderate pain or cramping.   metroNIDAZOLE 500 MG tablet Commonly known as: FLAGYL Take 1 tablet (500 mg total) by mouth 2 (two) times daily.   prenatal vitamin w/FE, FA 27-1 MG Tabs tablet Take 1 tablet by mouth daily at 12 noon.        Discharge home in stable condition Infant Feeding: Breast Infant Disposition:home with mother Discharge instruction: per After Visit Summary and Postpartum booklet. Activity: Advance as tolerated. Pelvic rest for 6 weeks.  Diet: routine diet Future Appointments: Future Appointments  Date Time Provider [place]  06/17/2020  1:30 PM Roma Schanz, CNM [facility]   Follow up Visit: Message sent to [facility] by Dr. Astrid Drafts  Please schedule this patient for a In person postpartum visit in 6 weeks with the following provider: Any provider. Additional Postpartum F/U:none  Low risk pregnancy complicated by: tobacco use, varicose veins Delivery mode:  Vaginal, Spontaneous  Anticipated Birth Control:  depo > interval BTL  Randa Ngo, MD OB Fellow, [facility] 05/14/2020 8:59 AM

## 2020-05-13 NOTE — Social Work (Signed)
MOB was referred for history of depression and anxiety.   * Referral screened out by Clinical Social Worker because none of the following criteria appear to apply:  ~ History of anxiety/depression during this pregnancy, or of post-partum depression following prior delivery. ~ Diagnosis of anxiety and/or depression within last 3 years. OR * MOB's symptoms currently being treated with medication and/or therapy.  Please contact the Clinical Social Worker if needs arise, by MOB request, or if MOB scores greater than 9/yes to question 10 on Edinburgh Postpartum Depression Screen.  Asmaa Tirpak, LCSWA Clinical Social Work [facility]  [phone]  

## 2020-05-13 NOTE — Lactation Note (Signed)
This note was copied from a baby's chart. Lactation Consultation Note  Patient Name: Tracy Choi [id number] Date: 05/13/2020 Reason for consult: Initial assessment;Term;Infant weight loss (-1%) Age:33 hours P2, term female infant. Per mom, infant had 3 stools and no voids yet. Per mom, infant has been sleepy and latches has been painful. Infant was sleepy, LC ask mom hand express, infant was given 3 mls of colostrum by spoon, afterwards became more alert and started cuing to breastfeed. Mom latched infant on her right breast using the football hold position, infant latched with depth and was still BF after 11 minutes when LC left the room. Mom understands if she feels pain to break the latch and re-latch infant at the breast, mom will ask for assistance with latch from RN or LC if needed. Mom will BF infant according to hunger cues, 8 to 12 or more times within 24 hours, STS. Per mom, she used the DEBP once today and gave infant 5 mls of EBM that she pumped. Mom will start pumping every 3 hours for 15 minutes on initial setting to help establish her milk supply.  Mom will continue to use DEBP after latching infant at breast she will give infant any pumped EBM. Mom shown how to use DEBP & how to disassemble, clean, & reassemble parts. Mom made aware of O/P services, breastfeeding support groups, community resources, and our phone # for post-discharge questions.  Maternal Data Has patient been taught Hand Expression?: Yes Does the patient have breastfeeding experience prior to this delivery?: Yes How long did the patient breastfeed?: Per mom, she BF her  65 year old son for 1 month  Feeding Mother's Current Feeding Choice: Breast Milk  LATCH Score Latch: Grasps breast easily, tongue down, lips flanged, rhythmical sucking.  Audible Swallowing: Spontaneous and intermittent  Type of Nipple: Everted at rest and after stimulation  Comfort (Breast/Nipple): Soft / non-tender  Hold  (Positioning): Assistance needed to correctly position infant at breast and maintain latch.  LATCH Score: 9   Lactation Tools Discussed/Used Tools: Pump Breast pump type: Double-Electric Breast Pump Pump Education: Setup, frequency, and cleaning Reason for Pumping: Help estabish milk supply, pump was set up by RN Pumping frequency: Mom knows to pump every 3 hours for 15 minutes on inital setting. Mom will give infant back any EBM that is pump after she latches infant at the breast.  Interventions Interventions: Breast feeding basics reviewed;Assisted with latch;Skin to skin;Hand express;Breast compression;Adjust position;Support pillows;Position options;Expressed milk;DEBP;Education  Discharge Pump: Personal;DEBP WIC Program: Yes  Consult Status Consult Status: Follow-up Date: 05/14/20 Follow-up type: In-patient    [name] 05/13/2020, 10:30 PM

## 2020-05-13 NOTE — Anesthesia Postprocedure Evaluation (Signed)
Anesthesia Post Note  Patient: Tracy Choi  Procedure(s) Performed: AN AD HOC LABOR EPIDURAL     Patient location during evaluation: Mother Baby Anesthesia Type: Epidural Level of consciousness: awake and alert Pain management: pain level controlled Vital Signs Assessment: post-procedure vital signs reviewed and stable Respiratory status: spontaneous breathing, nonlabored ventilation and respiratory function stable Cardiovascular status: stable Postop Assessment: no headache, no backache and epidural receding Anesthetic complications: no   No complications documented.  Last Vitals:  Vitals:   05/13/20 0035 05/13/20 0458  BP: 116/71 114/72  Pulse: 87 76  Resp: 18 18  Temp: 36.8 C 37.3 C  SpO2: 95% 99%    Last Pain:  Vitals:   05/13/20 0459  TempSrc:   PainSc: 2    Pain Goal:                   Tracy Choi

## 2020-05-13 NOTE — Discharge Instructions (Signed)

## 2020-05-14 ENCOUNTER — Ambulatory Visit: Payer: Self-pay

## 2020-05-14 MED ORDER — IBUPROFEN 600 MG PO TABS: 600.0000 mg | ORAL_TABLET | Freq: Three times a day (TID) | ORAL | 0 refills | Status: DC | PRN

## 2020-05-14 MED ORDER — COCONUT OIL OIL: 1.0000 "application " | TOPICAL_OIL | 0 refills | Status: DC | PRN

## 2020-05-14 NOTE — Lactation Note (Signed)
This note was copied from a baby's chart. Lactation Consultation Note  Patient Name: Tracy Choi [id number]'Y Date: 05/14/2020   Age:33 hours NICU RN called [facility] services mom complaints her breast flange is too small. LC entered room, mom is away went to have dinner, RN will call LC when mom returns to assess breast flange.  Maternal Data    Feeding Nipple Type: Nfant Slow Flow (purple)  LATCH Score Latch: Grasps breast easily, tongue down, lips flanged, rhythmical sucking.  Audible Swallowing: Spontaneous and intermittent  Type of Nipple: Everted at rest and after stimulation  Comfort (Breast/Nipple): Soft / non-tender  Hold (Positioning): No assistance needed to correctly position infant at breast.  LATCH Score: 10   Lactation Tools Discussed/Used    Interventions    Discharge    Consult Status      [name] 05/14/2020, 6:48 PM

## 2020-05-14 NOTE — Lactation Note (Signed)
This note was copied from a baby's chart. Lactation Consultation Note  Patient Name: Tracy Choi [id number] Date: 05/14/2020 Reason for consult: Follow-up assessment Age:33 hours  P2 mother whose infant is now 59 hours old.  This is a term baby at 39+5 weeks.  Mother breast fed her first child (now 69 years old) for one month.  Baby was in the nursery when I arrived.  Mother was pumping using the DEBP.  Reviewed pumping basics and observed mother during pumping.  She was able to obtain 10 mls of EBM.  Praised her efforts.  Reviewed basic breast feeding concepts and answered questions.  Mother has a DEBP for home use.  Engorgement prevention/treatment discussed.  Father asleep on couch.  Mother has our OP phone number for any concerns after discharge.   Maternal Data    Feeding Nipple Type: Slow - flow  LATCH Score                    Lactation Tools Discussed/Used    Interventions Interventions: Education  Discharge Discharge Education: Engorgement and breast care  Consult Status Consult Status: Complete Date: 05/14/20 Follow-up type: Call as needed    Mava Suares R Khelani Kops 05/14/2020, 9:07 AM

## 2020-05-14 NOTE — Lactation Note (Signed)
This note was copied from a baby's chart. Lactation Consultation Note  Patient Name: Boy Katelyne Galster QQPYP'P Date: 05/14/2020   Age:33 hours P2, term female infant in NICU. LC refitted mom with 30 mm breast flange, mom had redness around nipples and per mom, she felt friction and pain when using 27 mm breast flange. Mom feels 30 mm is a good fit and not pain when pumping, mom had expressed 10 mls in each bottle and was still pumping when LC left the room. Mom knows to use EBM and let dry on nipples or coconut oil to help with breast soreness.  Maternal Data    Feeding Nipple Type: Nfant Slow Flow (purple)  LATCH Score Latch: Grasps breast easily, tongue down, lips flanged, rhythmical sucking.  Audible Swallowing: Spontaneous and intermittent  Type of Nipple: Everted at rest and after stimulation  Comfort (Breast/Nipple): Soft / non-tender  Hold (Positioning): No assistance needed to correctly position infant at breast.  LATCH Score: 10   Lactation Tools Discussed/Used    Interventions    Discharge    Consult Status      [name] 05/14/2020, 7:19 PM

## 2020-05-15 ENCOUNTER — Encounter: Payer: BC Managed Care – PPO | Admitting: Advanced Practice Midwife

## 2020-05-15 ENCOUNTER — Other Ambulatory Visit: Payer: BC Managed Care – PPO

## 2020-05-16 ENCOUNTER — Encounter: Payer: Self-pay | Admitting: *Deleted

## 2020-05-21 ENCOUNTER — Telehealth: Payer: Self-pay | Admitting: Women's Health

## 2020-05-21 ENCOUNTER — Other Ambulatory Visit: Payer: Self-pay | Admitting: Women's Health

## 2020-05-21 MED ORDER — MEDROXYPROGESTERONE ACETATE 150 MG/ML IM SUSP: 150.0000 mg | INTRAMUSCULAR | 3 refills | Status: DC

## 2020-05-21 NOTE — Telephone Encounter (Signed)
Pt has not had sex. JSY

## 2020-05-21 NOTE — Telephone Encounter (Signed)
Pt aware Depo was sent to pharmacy. She can schedule nurse visit at any time. Call transferred to front for appt. JSY

## 2020-05-21 NOTE — Telephone Encounter (Signed)
Pt states she was not given her depo shot while in the hospital having her baby Please advise

## 2020-05-22 ENCOUNTER — Ambulatory Visit (INDEPENDENT_AMBULATORY_CARE_PROVIDER_SITE_OTHER): Payer: BC Managed Care – PPO | Admitting: *Deleted

## 2020-05-22 ENCOUNTER — Other Ambulatory Visit: Payer: Self-pay

## 2020-05-22 DIAGNOSIS — Z3042 Encounter for surveillance of injectable contraceptive: Secondary | ICD-10-CM | POA: Diagnosis not present

## 2020-05-22 MED ORDER — MEDROXYPROGESTERONE ACETATE 150 MG/ML IM SUSP
150.0000 mg | Freq: Once | INTRAMUSCULAR | Status: AC
Start: 2020-05-22 — End: 2020-05-22
  Administered 2020-05-22: 150 mg via INTRAMUSCULAR

## 2020-05-22 NOTE — Progress Notes (Signed)
   NURSE VISIT- INJECTION  SUBJECTIVE:  Tracy Choi is a 35 y.o. G75P2002 female here for a Depo Provera for contraception/period management. She is postpartum.   OBJECTIVE:  There were no vitals taken for this visit.  Appears well, in no apparent distress  Injection administered in: Left deltoid  Meds ordered this encounter  Medications  . medroxyPROGESTERone (DEPO-PROVERA) injection 150 mg    ASSESSMENT: Postpartum Depo Provera for contraception/period management PLAN: Follow-up: in 11-13 weeks for next Depo   Jobe Marker  05/22/2020 11:02 AM

## 2020-06-17 ENCOUNTER — Other Ambulatory Visit: Payer: Self-pay

## 2020-06-17 ENCOUNTER — Ambulatory Visit (INDEPENDENT_AMBULATORY_CARE_PROVIDER_SITE_OTHER): Payer: BC Managed Care – PPO | Admitting: Women's Health

## 2020-06-17 ENCOUNTER — Encounter: Payer: Self-pay | Admitting: Women's Health

## 2020-06-17 ENCOUNTER — Other Ambulatory Visit (HOSPITAL_COMMUNITY)
Admission: RE | Admit: 2020-06-17 | Discharge: 2020-06-17 | Disposition: A | Discharge status: Home / Self Care | Payer: BC Managed Care – PPO | Source: Ambulatory Visit | Attending: Obstetrics & Gynecology | Admitting: Obstetrics & Gynecology

## 2020-06-17 DIAGNOSIS — N898 Other specified noninflammatory disorders of vagina: Secondary | ICD-10-CM

## 2020-06-17 DIAGNOSIS — R350 Frequency of micturition: Secondary | ICD-10-CM | POA: Diagnosis not present

## 2020-06-17 NOTE — Progress Notes (Signed)
POSTPARTUM VISIT Patient name: Tracy Choi MRN [id number]  Date of birth: Oct 26, 1985 Chief Complaint:   Postpartum Care (Vaginal odor)  History of Present Illness:   Tracy Choi is a 35 y.o. G61P2002 Caucasian female being seen today for a postpartum visit. She is 5 weeks postpartum following a spontaneous vaginal delivery at 39.5 gestational weeks. IOL: no, for n/a. Anesthesia: epidural.  Laceration: 1st degree and periurethral.  Complications: none. Inpatient contraception: no.   Pregnancy uncomplicated. Tobacco use: yes. Substance use disorder: no. Last pap smear: 05/12/19 and results were NILM w/ HRHPV negative. Next pap smear due: 2024 No LMP recorded.  Postpartum course has been uncomplicated. Reports vaginal odor, no itching/irritation. Bleeding none. Bowel function is normal. Bladder function is abnormal: urinates frequently, only goes small amts, denies dysuria. Urinary incontinence? no, fecal incontinence? no Patient is not sexually active. Last sexual activity: prior to birth of baby. Desired contraception: Depo-1st dose 3/9. Patient does not want a pregnancy in the future.  Desired family size is 2 children.   Upstream - 06/17/20 1344      Pregnancy Intention Screening   Does the patient want to become pregnant in the next year? No    Does the patient's partner want to become pregnant in the next year? No    Would the patient like to discuss contraceptive options today? No      Contraception Wrap Up   Current Method Hormonal Injection    End Method Hormonal Injection    Contraception Counseling Provided No          The pregnancy intention screening data noted above was reviewed. Potential methods of contraception were discussed. The patient elected to proceed with Hormonal Injection.   Edinburgh Postpartum Depression Screening: negative  Edinburgh Postnatal Depression Scale - 06/17/20 1342      Edinburgh Postnatal Depression Scale:  In the Past 7 Days   I  have been able to laugh and see the funny side of things. 0    I have looked forward with enjoyment to things. 0    I have blamed myself unnecessarily when things went wrong. 0    I have been anxious or worried for no good reason. 2    I have felt scared or panicky for no good reason. 2    Things have been getting on top of me. 1    I have been so unhappy that I have had difficulty sleeping. 1    I have felt sad or miserable. 0    I have been so unhappy that I have been crying. 0    The thought of harming myself has occurred to me. 0    Edinburgh Postnatal Depression Scale Total 6          Baby's course has been complicated by cataract- has already had surgery. Baby is feeding by breast: milk supply adequate. Infant has a pediatrician/family doctor? Yes.  Childcare strategy if returning to work/school: trying to decide if going back to work or not.  Pt has material needs met for her and baby: Yes.   Review of Systems:   Pertinent items are noted in HPI Denies Abnormal vaginal discharge w/ itching/odor/irritation, headaches, visual changes, shortness of breath, chest pain, abdominal pain, severe nausea/vomiting, or problems with urination or bowel movements. Pertinent History Reviewed:  Reviewed past medical,surgical, obstetrical and family history.  Reviewed problem list, medications and allergies. OB History  Gravida Para Term Preterm AB Living  _0 0  0 2  SAB IAB Ectopic Multiple Live Births  0 0 0 0 2    # Outcome Date GA Lbr Len/2nd Weight Sex Delivery Anes PTL Lv  2 Term 05/12/20 82w5d01:50 / 00:10 6 lb 10.4 oz (3.016 kg) M Vag-Spont EPI, Local  LIV     Birth Comments: none  1 Term 08/27/11 432w2d9:01 / 00:27 7 lb 5.8 oz (3.34 kg) M Vag-Spont EPI  LIV     Birth Comments: molding, but within normal limits   Physical Assessment:   Vitals:   06/17/20 1338  BP: 107/74  Pulse: 89  Weight: 144 lb (65.3 kg)  Height: 5' 5" (1.651 m)  Body mass index is 23.96 kg/m.        Physical Examination:   General appearance: alert, well appearing, and in no distress  Mental status: alert, oriented to person, place, and time  Skin: warm & dry   Cardiovascular: normal heart rate noted   Respiratory: normal respiratory effort, no distress   Breasts: deferred, no complaints   Abdomen: soft, non-tender   Pelvic: VULVA: normal appearing vulva with no masses, tenderness or lesions, VAGINA: normal appearing vagina with normal color and discharge, no lesions, CERVIX: normal appearing cervix without discharge or lesions. Thin prep pap obtained: No  Rectal: not examined  Extremities: Edema: none   Chaperone: JaLevy Pupa       No results found for this or any previous visit (from the past 24 hour(s)).  Assessment & Plan:  1) Postpartum exam 2) 5 wks s/p spontaneous vaginal delivery 3) breast feeding 4) Depression screening 5) Contraception counseling> continue depo, schedule next dose today (last 3/9) 6) Vaginal odor> CV swab sent 7) Urinary frequency, hesitancy> urine cx  Essential components of care per ACOG recommendations:  1.  Mood and well being:  . If positive depression screen, discussed and plan developed.  . If using tobacco we discussed reduction/cessation and risk of relapse . If current substance abuse, we discussed and referral to local resources was offered.   2. Infant care and feeding:  . If breastfeeding, discussed returning to work, pumping, breastfeeding-associated pain, guidance regarding return to fertility while lactating if not using another method. If needed, patient was provided with a letter to be allowed to pump q 2-3hrs to support lactation in a private location with access to a refrigerator to store breastmilk.   . Recommended that all caregivers be immunized for flu, pertussis and other preventable communicable diseases . If pt does not have material needs met for her/baby, referred to local resources for help obtaining these.  3.  Sexuality, contraception and birth spacing . Provided guidance regarding sexuality, management of dyspareunia, and resumption of intercourse . Discussed avoiding interpregnancy interval <87m64m and recommended birth spacing of 18 months  4. Sleep and fatigue . Discussed coping options for fatigue and sleep disruption . Encouraged family/partner/community support of 4 hrs of uninterrupted sleep to help with mood and fatigue  5. Physical recovery  . If pt had a C/S, assessed incisional pain and providing guidance on normal vs prolonged recovery . If pt had a laceration, perineal healing and pain reviewed.  . If urinary or fecal incontinence, discussed management and referred to PT or uro/gyn if indicated  . Patient is safe to resume physical activity. Discussed attainment of healthy weight.  6.  Chronic disease management . Discussed pregnancy complications if any, and their implications for future childbearing and long-term maternal health. . Review recommendations for  prevention of recurrent pregnancy complications, such as 17 hydroxyprogesterone caproate to reduce risk for recurrent PTB not applicable, or aspirin to reduce risk of preeclampsia not applicable. . Pt had GDM: No. If yes, 2hr GTT scheduled: not applicable. . Reviewed medications and non-pregnant dosing including consideration of whether pt is breastfeeding using a reliable resource such as LactMed: yes . Referred for f/u w/ PCP or subspecialist providers as indicated: not applicable  7. Health maintenance . Mammogram at 35yo or earlier if indicated . Pap smears as indicated  Meds: No orders of the defined types were placed in this encounter.   Follow-up: Return for Depo injection (last on 3/9); then 1 yr physical.   Orders Placed This Encounter  Procedures  . Urine Culture    [place], [facility] 06/17/2020 2:07 PM

## 2020-06-19 LAB — CERVICOVAGINAL ANCILLARY ONLY
Bacterial Vaginitis (gardnerella): NEGATIVE
Candida Glabrata: NEGATIVE
Candida Vaginitis: NEGATIVE
Chlamydia: NEGATIVE
Comment: NEGATIVE
Comment: NEGATIVE
Comment: NEGATIVE
Comment: NEGATIVE
Comment: NEGATIVE
Comment: NORMAL
Neisseria Gonorrhea: NEGATIVE
Trichomonas: NEGATIVE

## 2020-06-20 LAB — URINE CULTURE

## 2020-08-14 ENCOUNTER — Ambulatory Visit (INDEPENDENT_AMBULATORY_CARE_PROVIDER_SITE_OTHER): Payer: BC Managed Care – PPO | Admitting: *Deleted

## 2020-08-14 ENCOUNTER — Other Ambulatory Visit: Payer: Self-pay

## 2020-08-14 DIAGNOSIS — Z3042 Encounter for surveillance of injectable contraceptive: Secondary | ICD-10-CM

## 2020-08-14 MED ORDER — MEDROXYPROGESTERONE ACETATE 150 MG/ML IM SUSP
150.0000 mg | Freq: Once | INTRAMUSCULAR | Status: AC
  Administered 2020-08-14: 150 mg via INTRAMUSCULAR

## 2020-08-14 NOTE — Progress Notes (Signed)
   NURSE VISIT- INJECTION  SUBJECTIVE:  Tracy Choi is a 35 y.o. G56P2002 female here for a Depo Provera for contraception/period management. She is a GYN patient.   OBJECTIVE:  There were no vitals taken for this visit.  Appears well, in no apparent distress  Injection administered in: Right deltoid  Meds ordered this encounter  Medications  . medroxyPROGESTERone (DEPO-PROVERA) injection 150 mg    ASSESSMENT: GYN patient Depo Provera for contraception/period management PLAN: Follow-up: in 11-13 weeks for next Depo   Annamarie Dawley  08/14/2020 11:31 AM

## 2020-08-15 ENCOUNTER — Encounter: Payer: Self-pay | Admitting: *Deleted

## 2020-10-05 ENCOUNTER — Other Ambulatory Visit: Payer: Self-pay | Admitting: Women's Health

## 2020-11-05 DIAGNOSIS — J22 Unspecified acute lower respiratory infection: Secondary | ICD-10-CM | POA: Diagnosis not present

## 2020-11-06 ENCOUNTER — Other Ambulatory Visit: Payer: Self-pay

## 2020-11-06 ENCOUNTER — Ambulatory Visit (INDEPENDENT_AMBULATORY_CARE_PROVIDER_SITE_OTHER): Payer: Medicaid Other

## 2020-11-06 DIAGNOSIS — Z3042 Encounter for surveillance of injectable contraceptive: Secondary | ICD-10-CM

## 2020-11-06 MED ORDER — MEDROXYPROGESTERONE ACETATE 150 MG/ML IM SUSY: PREFILLED_SYRINGE | Freq: Once | INTRAMUSCULAR | Status: AC

## 2020-11-06 NOTE — Progress Notes (Signed)
   NURSE VISIT- INJECTION  SUBJECTIVE:  Tracy Choi is a 35 y.o. G21P2002 female here for a Depo Provera for contraception/period management. She is a GYN patient.   OBJECTIVE:  There were no vitals taken for this visit.  Appears well, in no apparent distress  Injection administered in: Left deltoid  Meds ordered this encounter  Medications   medroxyPROGESTERone Acetate SUSY    ASSESSMENT: GYN patient Depo Provera for contraception/period management PLAN: Follow-up: in 11-13 weeks for next Depo   Brittnye Josephs A Concettina Leth  11/06/2020 10:57 AM

## 2021-01-29 ENCOUNTER — Ambulatory Visit (INDEPENDENT_AMBULATORY_CARE_PROVIDER_SITE_OTHER): Payer: Medicaid Other | Admitting: *Deleted

## 2021-01-29 ENCOUNTER — Other Ambulatory Visit: Payer: Self-pay

## 2021-01-29 DIAGNOSIS — Z3042 Encounter for surveillance of injectable contraceptive: Secondary | ICD-10-CM

## 2021-01-29 MED ORDER — MEDROXYPROGESTERONE ACETATE 150 MG/ML IM SUSP
150.0000 mg | Freq: Once | INTRAMUSCULAR | Status: AC
  Administered 2021-01-29: 150 mg via INTRAMUSCULAR

## 2021-01-29 NOTE — Progress Notes (Addendum)
   NURSE VISIT- INJECTION  SUBJECTIVE:  Tracy Choi is a 35 y.o. G29P2002 female here for a Depo Provera for contraception/period management. She is a GYN patient.   OBJECTIVE:  There were no vitals taken for this visit.  Appears well, in no apparent distress  Injection administered in: Right deltoid  Meds ordered this encounter  Medications   medroxyPROGESTERone (DEPO-PROVERA) injection 150 mg    ASSESSMENT: GYN patient Depo Provera for contraception/period management PLAN: Follow-up: in 11-13 weeks for next Depo   Jobe Marker  01/29/2021 12:01 PM  Chart reviewed for nurse visit. Agree with plan of care.  Jacklyn Shell, [place] 01/29/2021 12:35 PM

## 2021-04-18 ENCOUNTER — Telehealth: Payer: Self-pay

## 2021-04-18 NOTE — Telephone Encounter (Signed)
Pt called in regard to Depo   Pt states that depo prescription should have been switched over to [facility], and when pt went to pick up it was not there. Pt needs depo sent in to [facility]   Appt 2/8

## 2021-04-18 NOTE — Telephone Encounter (Signed)
Returned pt's call, two identifiers used. Asked pt to call [facility] to have the prescription transferred. Pt stated that she was physically at the [facility] pharmacy and she spoke to them directly while on the phone. They stated they would transfer prescription.

## 2021-04-23 ENCOUNTER — Other Ambulatory Visit: Payer: Self-pay

## 2021-04-23 ENCOUNTER — Ambulatory Visit (INDEPENDENT_AMBULATORY_CARE_PROVIDER_SITE_OTHER): Payer: Medicaid Other

## 2021-04-23 DIAGNOSIS — Z3042 Encounter for surveillance of injectable contraceptive: Secondary | ICD-10-CM

## 2021-04-23 MED ORDER — MEDROXYPROGESTERONE ACETATE 150 MG/ML IM SUSY
PREFILLED_SYRINGE | Freq: Once | INTRAMUSCULAR | Status: AC
Start: 2021-04-23 — End: 2021-04-23

## 2021-04-23 NOTE — Progress Notes (Signed)
° °  NURSE VISIT- INJECTION  SUBJECTIVE:  Tracy Choi is a 36 y.o. G86P2002 female here for a Depo Provera for contraception/period management. She is a GYN patient.   OBJECTIVE:  There were no vitals taken for this visit.  Appears well, in no apparent distress  Injection administered in: Right deltoid  Meds ordered this encounter  Medications   medroxyPROGESTERone Acetate SUSY    ASSESSMENT: GYN patient Depo Provera for contraception/period management PLAN: Follow-up: in 11-13 weeks for next Depo   Joleah Kosak A Clarion Mooneyhan  04/23/2021 11:38 AM

## 2021-06-24 ENCOUNTER — Encounter: Payer: Self-pay | Admitting: Women's Health

## 2021-06-24 ENCOUNTER — Ambulatory Visit (INDEPENDENT_AMBULATORY_CARE_PROVIDER_SITE_OTHER): Payer: Medicaid Other | Admitting: Women's Health

## 2021-06-24 ENCOUNTER — Other Ambulatory Visit (HOSPITAL_COMMUNITY)
Admission: RE | Admit: 2021-06-24 | Discharge: 2021-06-24 | Disposition: A | Discharge status: Home / Self Care | Payer: Medicaid Other | Source: Ambulatory Visit | Attending: Women's Health | Admitting: Women's Health

## 2021-06-24 VITALS — BP 137/77 | HR 95 | Ht 65.5 in | Wt 177.5 lb

## 2021-06-24 DIAGNOSIS — N898 Other specified noninflammatory disorders of vagina: Secondary | ICD-10-CM

## 2021-06-24 DIAGNOSIS — Z01419 Encounter for gynecological examination (general) (routine) without abnormal findings: Secondary | ICD-10-CM | POA: Diagnosis not present

## 2021-06-24 DIAGNOSIS — A599 Trichomoniasis, unspecified: Secondary | ICD-10-CM | POA: Diagnosis not present

## 2021-06-24 NOTE — Progress Notes (Signed)
? ?WELL-WOMAN EXAMINATION ?Patient name: Tracy Choi MRN [id number]  Date of birth: 11/27/1985 ?Chief Complaint:   ?Gynecologic Exam (Vaginal odor) ? ?History of Present Illness:   ?Tracy Choi is a 36 y.o. G85P2002 Caucasian female being seen today for a routine well-woman exam.  ?Current complaints: vaginal odor since had baby, worse after sex ? ?PCP: [facility]      ?does not desire labs ?No LMP recorded. Patient has had an injection. ?The current method of family planning is Depo-Provera injections.  ?Last pap 05/12/19. Results were: NILM w/ HRHPV negative. H/O abnormal pap: no ?Last mammogram: never. Results were: N/A. Family h/o breast cancer: MGM ?Last colonoscopy: never. Results were: normal. Family h/o colorectal cancer: no ? ? ?  06/24/2021  ? 11:14 AM 02/22/2020  ?  9:18 AM 11/13/2019  ?  9:21 AM 05/12/2019  ? 12:11 PM 03/24/2018  ?  8:29 AM  ?Depression screen PHQ 2/9  ?Decreased Interest 0 1 0 0 0  ?Down, Depressed, Hopeless 0 1 0 0 1  ?PHQ - 2 Score 0 2 0 0 1  ?Altered sleeping 1 1 3     ?Tired, decreased energy 1 0 3    ?Change in appetite 0 0 0    ?Feeling bad or failure about yourself  0 0 0    ?Trouble concentrating 0 0 0    ?Moving slowly or fidgety/restless 0 0 0    ?Suicidal thoughts 0 0 0    ?PHQ-9 Score 2 3 6     ?Difficult doing work/chores   Not difficult at all    ? ?  ? ?  06/24/2021  ? 11:14 AM 02/22/2020  ?  9:19 AM 11/13/2019  ?  9:22 AM  ?GAD 7 : Generalized Anxiety Score  ?Nervous, Anxious, on Edge 1 0 3  ?Control/stop worrying 1 1 3   ?Worry too much - different things 1 0 3  ?Trouble relaxing 0 1 2  ?Restless 0 1 0  ?Easily annoyed or irritable 0 1 3  ?Afraid - awful might happen 0 1 0  ?Total GAD 7 Score 3 5 14   ? ? ? ?Review of Systems:   ?Pertinent items are noted in HPI ?Denies any headaches, blurred vision, fatigue, shortness of breath, chest pain, abdominal pain, abnormal vaginal discharge/itching/odor/irritation, problems with periods, bowel movements, urination, or intercourse  unless otherwise stated above. ?Pertinent History Reviewed:  ?Reviewed past medical,surgical, social and family history.  ?Reviewed problem list, medications and allergies. ?Physical Assessment:  ? ?Vitals:  ? 06/24/21 1109  ?BP: 137/77  ?Pulse: 95  ?Weight: 177 lb 8 oz (80.5 kg)  ?Height: 5' 5.5" (1.664 m)  ?Body mass index is 29.09 kg/m?. ?  ?     Physical Examination:  ? General appearance - well appearing, and in no distress ? Mental status - alert, oriented to person, place, and time ? Psych:  She has a normal mood and affect ? Skin - warm and dry, normal color, no suspicious lesions noted ? Chest - effort normal, all lung fields clear to auscultation bilaterally ? Heart - normal rate and regular rhythm ? Neck:  midline trachea, no thyromegaly or nodules ? Breasts - breasts appear normal, no suspicious masses, no skin or nipple changes or  axillary nodes ? Abdomen - soft, nontender, nondistended, no masses or organomegaly ? Pelvic - VULVA: normal appearing vulva with no masses, tenderness or lesions  VAGINA: normal appearing vagina with normal color and discharge, no lesions  CERVIX: normal  appearing cervix without discharge or lesions, no CMT ? Thin prep pap is not done  ? UTERUS: uterus is felt to be normal size, shape, consistency and nontender  ? ADNEXA: No adnexal masses or tenderness noted ? Extremities:  No swelling or varicosities noted ? ?Chaperone: Malachy Mood   ? ?No results found for this or any previous visit (from the past 24 hour(s)).  ?Assessment & Plan:  ?1) Well-Woman Exam ? ?2) Vaginal odor> worse after sex, CV swab sent, if BV will tx and rx metrogel for after sex ? ?Labs/procedures today: CV swab ? ?Mammogram: @ 34yo, or sooner if problems ?Colonoscopy: @ 34yo, or sooner if problems ? ?No orders of the defined types were placed in this encounter. ? ? ?Meds: No orders of the defined types were placed in this encounter. ? ? ?Follow-up: Return in about 1 year (around 06/25/2022) for Pap &  physical. ? ?Cheral Marker CNM, WHNP-BC ?06/24/2021 ?11:43 AM  ?

## 2021-06-24 NOTE — Patient Instructions (Signed)
For recurrent BV (bacterial vaginosis) ? ?You may also want to consider making the changes below:  ?Soap: Unscented Dove (white box light green writing)  ?Wash cloth: use a separate white washcloth for your genital area ?Laundry detergent: Dreft or unscented Arm n' Hammer  ?Underwear: White 100% cotton panties (NOT just cotton crouch) ?Sanitary pads/tampons: Unscented only-If it doesn't SAY unscented it can have a scent/perfume    ?NO PERFUMES OR LOTIONS OR POTIONS in the genital area (may use regular KY) ?Condoms: hypoallergenic only, non-dyed (no color) ?Toilet paper: White unscented only  ?

## 2021-06-25 ENCOUNTER — Telehealth: Payer: Self-pay

## 2021-06-25 ENCOUNTER — Other Ambulatory Visit: Payer: Self-pay | Admitting: Women's Health

## 2021-06-25 DIAGNOSIS — A599 Trichomoniasis, unspecified: Secondary | ICD-10-CM | POA: Insufficient documentation

## 2021-06-25 LAB — CERVICOVAGINAL ANCILLARY ONLY
Bacterial Vaginitis (gardnerella): POSITIVE — AB
Candida Glabrata: NEGATIVE
Candida Vaginitis: NEGATIVE
Chlamydia: NEGATIVE
Comment: NEGATIVE
Comment: NEGATIVE
Comment: NEGATIVE
Comment: NEGATIVE
Comment: NEGATIVE
Comment: NORMAL
Neisseria Gonorrhea: NEGATIVE
Trichomonas: POSITIVE — AB

## 2021-06-25 MED ORDER — METRONIDAZOLE 500 MG PO TABS
500.0000 mg | ORAL_TABLET | Freq: Two times a day (BID) | ORAL | 0 refills | Status: DC
Start: 2021-06-25 — End: 2021-06-26

## 2021-06-25 NOTE — Telephone Encounter (Signed)
Pt saw MyChart message. She wants [place] to treat her partner. Tracy Choi DOB [birth year] no allergies [facility] in [place]. Thanks!! JSY ?

## 2021-06-25 NOTE — Progress Notes (Signed)
Metronidazole 2gm po x 1 w/ 0RF called in for partner Gwenette Greet DOB [birth year] no allergies [facility] in [place] ?Roma Schanz, CNM, WHNP-BC ?06/25/2021 ?4:14 PM  ?

## 2021-06-25 NOTE — Telephone Encounter (Signed)
PT WANTS A CALL BACK BEFORE THE END OF DAY ABOUT HER TEST RESULTS. ?

## 2021-06-25 NOTE — Addendum Note (Signed)
Addended by: Cheral Marker on: 06/25/2021 02:13 PM ? ? Modules accepted: Orders ? ?

## 2021-06-26 ENCOUNTER — Telehealth: Payer: Self-pay

## 2021-06-26 ENCOUNTER — Other Ambulatory Visit: Payer: Self-pay | Admitting: Obstetrics & Gynecology

## 2021-06-26 ENCOUNTER — Other Ambulatory Visit: Payer: Self-pay | Admitting: Adult Health

## 2021-06-26 ENCOUNTER — Encounter: Payer: Self-pay | Admitting: Women's Health

## 2021-06-26 DIAGNOSIS — A599 Trichomoniasis, unspecified: Secondary | ICD-10-CM

## 2021-06-26 MED ORDER — TINIDAZOLE 500 MG PO TABS: 2.0000 g | ORAL_TABLET | Freq: Every day | ORAL | 0 refills | Status: AC

## 2021-06-26 NOTE — Progress Notes (Signed)
Rx sent in for Tindamax instead ?Please let us if any issues ?

## 2021-06-26 NOTE — Telephone Encounter (Signed)
Called pt to relay Dr Annie Main advice and new prescription information. Pt confirmed understanding. ?

## 2021-06-26 NOTE — Telephone Encounter (Signed)
Pt called stating that she is experiencing hives and itching after taking Metronidazole. Two identifiers used. Pt stated that after 1 dose she thought she had been bitten by mosquitoes, but after the 2nd dose, the itching became much worse. She was instructed not to take any more doses and start taking Benadryl as directed on the bottle. She was advised that if she experienced any swelling of the mouth, lips, face, or airway, to go to the ED immediately. Told pt a provider would be notified so that she could get a different medication to treat her. Pt confirmed understanding. ?

## 2021-06-27 ENCOUNTER — Telehealth: Payer: Self-pay

## 2021-06-27 NOTE — Telephone Encounter (Signed)
I called pt and she was at [facility] ER with hives and shortness of breath. Pt hasn't started the Tindamax yet. JSY ?

## 2021-06-27 NOTE — Telephone Encounter (Signed)
PT CALLED AND WANTED TO TALK TO YOU ABOUT HER INSTRUCTIONS ON HER MEDICINE. ?

## 2021-06-30 ENCOUNTER — Encounter: Payer: Self-pay | Admitting: *Deleted

## 2021-07-01 NOTE — Telephone Encounter (Signed)
A 

## 2021-07-09 ENCOUNTER — Telehealth: Payer: Self-pay | Admitting: Women's Health

## 2021-07-09 ENCOUNTER — Other Ambulatory Visit: Payer: Self-pay | Admitting: Women's Health

## 2021-07-09 MED ORDER — MEDROXYPROGESTERONE ACETATE 150 MG/ML IM SUSP
150.0000 mg | INTRAMUSCULAR | 3 refills | Status: DC
Start: 2021-07-09 — End: 2021-11-11

## 2021-07-09 NOTE — Addendum Note (Signed)
Addended by: Cyril Mourning A on: 07/09/2021 03:36 PM ? ? Modules accepted: Orders ? ?

## 2021-07-09 NOTE — Telephone Encounter (Signed)
Pt aware that depo refill sent in to [place]  ?

## 2021-07-09 NOTE — Telephone Encounter (Signed)
Patient needs a refill on her depo. Her appointment is on Friday. Please advise.  ?

## 2021-07-11 ENCOUNTER — Ambulatory Visit (INDEPENDENT_AMBULATORY_CARE_PROVIDER_SITE_OTHER): Payer: Medicaid Other

## 2021-07-11 ENCOUNTER — Encounter: Payer: Self-pay | Admitting: *Deleted

## 2021-07-11 DIAGNOSIS — Z3042 Encounter for surveillance of injectable contraceptive: Secondary | ICD-10-CM | POA: Diagnosis not present

## 2021-07-11 MED ORDER — MEDROXYPROGESTERONE ACETATE 150 MG/ML IM SUSY
PREFILLED_SYRINGE | Freq: Once | INTRAMUSCULAR | Status: AC
Start: 2021-07-11 — End: 2021-07-11

## 2021-07-11 NOTE — Progress Notes (Signed)
? ?  NURSE VISIT- INJECTION ? ?SUBJECTIVE:  ?Tracy Choi is a 36 y.o. [phone] female here for a Depo Provera for contraception/period management. She is a GYN patient.  ? ?OBJECTIVE:  ?There were no vitals taken for this visit.  ?Appears well, in no apparent distress ? ?Injection administered in: Left deltoid ? ?Meds ordered this encounter  ?Medications  ? medroxyPROGESTERone Acetate SUSY  ? ? ?ASSESSMENT: ?GYN patient Depo Provera for contraception/period management ?PLAN: ?Follow-up: in 11-13 weeks for next Depo  ? ?Jonie Burdell A Thersea Manfredonia  ?07/11/2021 ?10:28 AM ? ?

## 2021-07-28 ENCOUNTER — Ambulatory Visit: Payer: Medicaid Other | Admitting: Women's Health

## 2021-07-28 ENCOUNTER — Encounter: Payer: Self-pay | Admitting: Women's Health

## 2021-07-28 ENCOUNTER — Other Ambulatory Visit (HOSPITAL_COMMUNITY)
Admission: RE | Admit: 2021-07-28 | Discharge: 2021-07-28 | Disposition: A | Discharge status: Home / Self Care | Payer: Medicaid Other | Source: Ambulatory Visit | Attending: Women's Health | Admitting: Women's Health

## 2021-07-28 VITALS — BP 131/81 | HR 84 | Ht 65.5 in | Wt 172.0 lb

## 2021-07-28 DIAGNOSIS — R3915 Urgency of urination: Secondary | ICD-10-CM | POA: Diagnosis not present

## 2021-07-28 DIAGNOSIS — Z113 Encounter for screening for infections with a predominantly sexual mode of transmission: Secondary | ICD-10-CM | POA: Diagnosis present

## 2021-07-28 DIAGNOSIS — A599 Trichomoniasis, unspecified: Secondary | ICD-10-CM | POA: Diagnosis not present

## 2021-07-28 DIAGNOSIS — Z8619 Personal history of other infectious and parasitic diseases: Secondary | ICD-10-CM | POA: Diagnosis present

## 2021-07-28 DIAGNOSIS — Z3009 Encounter for other general counseling and advice on contraception: Secondary | ICD-10-CM

## 2021-07-28 LAB — POCT URINALYSIS DIPSTICK OB
Blood, UA: NEGATIVE
Glucose, UA: NEGATIVE
Ketones, UA: NEGATIVE
Leukocytes, UA: NEGATIVE
Nitrite, UA: NEGATIVE
POC,PROTEIN,UA: NEGATIVE

## 2021-07-28 NOTE — Patient Instructions (Signed)
Salpingectomy ?Salpingectomy, also called tubectomy, is the surgical removal of one of the fallopian tubes. The fallopian tubes allow eggs to travel from the ovaries to the uterus. Removing one fallopian tube does not prevent pregnancy. It also does not cause problems with your menstrual periods. You may need this procedure if you: ?Have an ectopic pregnancy. This is when a fertilized egg attaches to the fallopian tube instead of the uterus. An ectopic pregnancy can cause the tube to burst or tear (rupture). ?Have an infected fallopian tube. ?Have cancer of the fallopian tube or nearby organs. ?Have had an ovary removed due to a cyst or tumor. ?Have had your uterus removed. ?Are at high risk for ovarian cancer. ?There are three different methods that can be used for a salpingectomy: ?An open method in which one large incision is made in your abdomen. ?A laparoscopic method in which a thin, lighted tube with a tiny camera (laparoscope) is used to help perform the procedure. The laparoscope allows a surgeon to make several small incisions in the abdomen instead of one large incision. ?A robot-assisted method in which a computer is used to control surgical instruments that are attached to robotic arms. ?Tell a health care provider about: ?Any allergies you have. ?All medicines you are taking, including vitamins, herbs, eye drops, creams, and over-the-counter medicines. ?Any problems you or family members have had with anesthetic medicines. ?Any blood disorders you have. ?Any surgeries you have had. ?Any medical conditions you have. ?Whether you are pregnant or may be pregnant. ?What are the risks? ?Generally, this is a safe procedure. However, problems may occur, including: ?Infection. ?Bleeding. ?Allergic reactions to medicines. ?Blood clots in the legs or lungs. ?Damage to nearby structures or organs. ?What happens before the procedure? ?Staying hydrated ?Follow instructions from your health care provider about  hydration, which may include: ?Up to 2 hours before the procedure - you may continue to drink clear liquids, such as water, clear fruit juice, black coffee, and plain tea. ?Eating and drinking restrictions ?Follow instructions from your health care provider about eating and drinking, which may include: ?8 hours before the procedure - stop eating heavy meals or foods, such as meat, fried foods, or fatty foods. ?6 hours before the procedure - stop eating light meals or foods, such as toast or cereal. ?6 hours before the procedure - stop drinking milk or drinks that contain milk. ?2 hours before the procedure - stop drinking clear liquids. ?Medicines ?Ask your health care provider about: ?Changing or stopping your regular medicines. This is especially important if you are taking diabetes medicines or blood thinners. ?Taking medicines such as aspirin and ibuprofen. These medicines can thin your blood. Do not take these medicines unless your health care provider tells you to take them. ?Taking over-the-counter medicines, vitamins, herbs, and supplements. ?General instructions ?Do not use any products that contain nicotine or tobacco for at least 4 weeks before the procedure. These products include cigarettes, chewing tobacco, and vaping devices, such as e-cigarettes. If you need help quitting, ask your health care provider. ?You may have an exam or tests, such as an electrocardiogram (ECG) or a blood or urine test. ?Ask your health care provider: ?How your surgery site will be marked. ?What steps will be taken to help prevent infection. These steps may include: ?Removing hair at the surgery site. ?Washing skin with a germ-killing soap. ?Taking antibiotic medicine. ?Plan to have a responsible adult take you home from the hospital or clinic. ?If you will be  going home right after the procedure, plan to have a responsible adult care for you for the time you are told. This is important. ?What happens during the  procedure? ?An IV will be inserted into one of your veins. ?You will be given one or both of the following: ?A medicine to help you relax (sedative). ?A medicine to make you fall asleep (general anesthetic). ?A small, thin tube (catheter) may be inserted through your urethra and into your bladder. This will drain urine during your procedure. ?Depending on the type of procedure you are having, one incision or several small incisions will be made in your abdomen. ?Your fallopian tube and ovary will be cut away from the uterus and removed. ?Your blood vessels will be clamped and tied to prevent excess bleeding. ?The incision or incisions in your abdomen will be closed with stitches (sutures), staples, or skin glue. ?A bandage (dressing) may be placed over your incision or incisions. ?The procedure may vary among health care providers and hospitals. ?What happens after the procedure? ? ?Your blood pressure, heart rate, breathing rate, and blood oxygen level will be monitored until you leave the hospital or clinic. ?You may continue to receive fluids and medicines through an IV. ?You may continue to have a catheter draining your urine. ?You may have to wear compression stockings. These stockings help to prevent blood clots and reduce swelling in your legs. ?You will be given pain medicine as needed. ?If you were given a sedative during the procedure, it can affect you for several hours. Do not drive or operate machinery until your health care provider says that it is safe. ?Summary ?Salpingectomy is a surgical procedure to remove one of the fallopian tubes. ?The procedure may be done with an open incision, a thin, lighted tube with a tiny camera (laparoscope), or computer-controlled instruments. ?Depending on the type of procedure you have, one incision or several small incisions will be made in your abdomen. ?Your blood pressure, heart rate, breathing rate, and blood oxygen level will be monitored until you leave the  hospital or clinic. ?Plan to have a responsible adult take you home from the hospital or clinic. ?This information is not intended to replace advice given to you by your health care provider. Make sure you discuss any questions you have with your health care provider. ?Document Revised: 01/23/2020 Document Reviewed: 01/23/2020 ?Elsevier Patient Education ? [place]. ? ?

## 2021-07-28 NOTE — Progress Notes (Signed)
? ?GYN VISIT ?Patient name: Tracy Choi MRN [id number]  Date of birth: Mar 08, 1986 ?Chief Complaint:   ?Follow-up (POC trich (requested to see Selena Batten) and ?uti) ? ?History of Present Illness:   ?Tracy Choi is a 36 y.o. G34P2002 Caucasian female being seen today for trichomonas proof of cure.  Positive on 06/24/21, rx'd metronidazole, had reaction (hives), went to ED. Switched to tinidazole, finished all of pills. Partner finished his, waited at least 7d to have sex. Some odor still, no other sx. Some urinary urgency and only small amt comes out, no dysuria. Wants BTL, gaining weight w/ depo.  ?No LMP recorded. Patient has had an injection. ?The current method of family planning is Depo-Provera injections.  ?Last pap 05/12/19. Results were: NILM w/ HRHPV negative ? ? ?  06/24/2021  ? 11:14 AM 02/22/2020  ?  9:18 AM 11/13/2019  ?  9:21 AM 05/12/2019  ? 12:11 PM 03/24/2018  ?  8:29 AM  ?Depression screen PHQ 2/9  ?Decreased Interest 0 1 0 0 0  ?Down, Depressed, Hopeless 0 1 0 0 1  ?PHQ - 2 Score 0 2 0 0 1  ?Altered sleeping 1 1 3     ?Tired, decreased energy 1 0 3    ?Change in appetite 0 0 0    ?Feeling bad or failure about yourself  0 0 0    ?Trouble concentrating 0 0 0    ?Moving slowly or fidgety/restless 0 0 0    ?Suicidal thoughts 0 0 0    ?PHQ-9 Score 2 3 6     ?Difficult doing work/chores   Not difficult at all    ? ?  ? ?  06/24/2021  ? 11:14 AM 02/22/2020  ?  9:19 AM 11/13/2019  ?  9:22 AM  ?GAD 7 : Generalized Anxiety Score  ?Nervous, Anxious, on Edge 1 0 3  ?Control/stop worrying 1 1 3   ?Worry too much - different things 1 0 3  ?Trouble relaxing 0 1 2  ?Restless 0 1 0  ?Easily annoyed or irritable 0 1 3  ?Afraid - awful might happen 0 1 0  ?Total GAD 7 Score 3 5 14   ? ? ? ?Review of Systems:   ?Pertinent items are noted in HPI ?Denies fever/chills, dizziness, headaches, visual disturbances, fatigue, shortness of breath, chest pain, abdominal pain, vomiting, abnormal vaginal discharge/itching/odor/irritation,  problems with periods, bowel movements, urination, or intercourse unless otherwise stated above.  ?Pertinent History Reviewed:  ?Reviewed past medical,surgical, social, obstetrical and family history.  ?Reviewed problem list, medications and allergies. ?Physical Assessment:  ? ?Vitals:  ? 07/28/21 1027  ?BP: 131/81  ?Pulse: 84  ?Weight: 172 lb (78 kg)  ?Height: 5' 5.5" (1.664 m)  ?Body mass index is 28.19 kg/m?. ? ?     Physical Examination:  ? General appearance: alert, well appearing, and in no distress ? Mental status: alert, oriented to person, place, and time ? Skin: warm & dry  ? Cardiovascular: normal heart rate noted ? Respiratory: normal respiratory effort, no distress ? Abdomen: soft, non-tender  ? Pelvic: VULVA: normal appearing vulva with no masses, tenderness or lesions, VAGINA: normal appearing vagina with normal color and discharge, no lesions, CERVIX: normal appearing cervix without discharge or lesions ? Extremities: no edema  ? ?Chaperone:   ? ?Results for orders placed or performed in visit on 07/28/21 (from the past 24 hour(s))  ?POC Urinalysis Dipstick OB  ? Collection Time: 07/28/21 10:33 AM  ?Result Value Ref  Range  ? Color, UA    ? Clarity, UA    ? Glucose, UA Negative Negative  ? Bilirubin, UA    ? Ketones, UA neg   ? Spec Grav, UA    ? Blood, UA neg   ? pH, UA    ? POC,PROTEIN,UA Negative Negative, Trace, Small (1+), Moderate (2+), Large (3+), 4+  ? Urobilinogen, UA    ? Nitrite, UA neg   ? Leukocytes, UA Negative Negative  ? Appearance    ? Odor    ?  ?Assessment & Plan:  ?1) Recent trichomonas> POC today ? ?2) Urinary urgency, only voiding small amts> neg dipstick, send cx ? ?3) Wants BTL> discussed salpingectomy, f/u w/ MD (wants female) for pre-op ? ?Meds: No orders of the defined types were placed in this encounter. ? ? ?Orders Placed This Encounter  ?Procedures  ? Urine Culture  ? POC Urinalysis Dipstick OB  ? ? ?Return for 1st available pre- op w/ [facility]  only. ? ?Cheral Marker CNM, WHNP-BC ?07/28/2021 ?10:53 AM  ?

## 2021-07-29 ENCOUNTER — Telehealth: Payer: Self-pay

## 2021-07-29 LAB — CERVICOVAGINAL ANCILLARY ONLY
Bacterial Vaginitis (gardnerella): POSITIVE — AB
Candida Glabrata: NEGATIVE
Candida Vaginitis: NEGATIVE
Chlamydia: NEGATIVE
Comment: NEGATIVE
Comment: NEGATIVE
Comment: NEGATIVE
Comment: NEGATIVE
Comment: NEGATIVE
Comment: NORMAL
Neisseria Gonorrhea: NEGATIVE
Trichomonas: NEGATIVE

## 2021-07-29 MED ORDER — CLINDAMYCIN PHOSPHATE 2 % VA CREA: 1.0000 | TOPICAL_CREAM | Freq: Every day | VAGINAL | 0 refills | Status: DC

## 2021-07-29 NOTE — Addendum Note (Signed)
Addended by: Roma Schanz on: 07/29/2021 03:32 PM ? ? Modules accepted: Orders ? ?

## 2021-07-29 NOTE — Telephone Encounter (Signed)
Pt called and stated that when she last saw [facility] for reoccurring BV, that she told her that she could call her something in for.  Pt stated that nothing was ever called in.  Would like something called in. ?

## 2021-07-30 ENCOUNTER — Other Ambulatory Visit: Payer: Self-pay | Admitting: Adult Health

## 2021-07-30 LAB — URINE CULTURE

## 2021-07-30 MED ORDER — TINIDAZOLE 500 MG PO TABS: ORAL_TABLET | ORAL | 0 refills | Status: DC

## 2021-07-30 NOTE — Progress Notes (Unsigned)
Will rx tindamax ? ?

## 2021-08-01 ENCOUNTER — Telehealth: Payer: Self-pay | Admitting: Adult Health

## 2021-08-01 NOTE — Telephone Encounter (Signed)
Fax with CoverMyMeds received.

## 2021-08-01 NOTE — Telephone Encounter (Signed)
[employer] called for patient to say that her rx tinidazole (500mg ) needs a prior authorization. You can call them at [phone].

## 2021-08-04 ENCOUNTER — Telehealth: Payer: Self-pay

## 2021-08-04 NOTE — Telephone Encounter (Signed)
Received fax related to PA request with additional information needed. Called pharmacy and pt had picked up the prescribed medication on Saturday, 07/23/21. No further action required.

## 2021-08-05 ENCOUNTER — Encounter: Payer: Self-pay | Admitting: Obstetrics & Gynecology

## 2021-08-05 ENCOUNTER — Ambulatory Visit (INDEPENDENT_AMBULATORY_CARE_PROVIDER_SITE_OTHER): Payer: Medicaid Other | Admitting: Obstetrics & Gynecology

## 2021-08-05 VITALS — BP 129/82 | HR 92 | Ht 65.0 in | Wt 171.4 lb

## 2021-08-05 DIAGNOSIS — Z3009 Encounter for other general counseling and advice on contraception: Secondary | ICD-10-CM | POA: Diagnosis not present

## 2021-08-05 NOTE — Progress Notes (Signed)
   GYN VISIT Patient name: Tracy Choi MRN [id number]  Date of birth: Jun 17, 1985 Chief Complaint:   Pre-op Exam  History of Present Illness:   Tracy Choi is a 36 y.o. [phone]  female being seen today for preop evaluation for permanent sterilization.     Currently on Depot, but desires a more permanent option as she does not want another pregnancy ever.    No period with Depot.  Denies irregular discharge or irritation.  Denies pelvic or abdominal pain.  No acute gyn concerns.  No LMP recorded (lmp unknown). Patient has had an injection.     06/24/2021   11:14 AM 02/22/2020    9:18 AM 11/13/2019    9:21 AM 05/12/2019   12:11 PM 03/24/2018    8:29 AM  Depression screen PHQ 2/9  Decreased Interest 0 1 0 0 0  Down, Depressed, Hopeless 0 1 0 0 1  PHQ - 2 Score 0 2 0 0 1  Altered sleeping 1 1 3     Tired, decreased energy 1 0 3    Change in appetite 0 0 0    Feeling bad or failure about yourself  0 0 0    Trouble concentrating 0 0 0    Moving slowly or fidgety/restless 0 0 0    Suicidal thoughts 0 0 0    PHQ-9 Score 2 3 6     Difficult doing work/chores   Not difficult at all       Review of Systems:   Pertinent items are noted in HPI Denies fever/chills, dizziness, headaches, visual disturbances, fatigue, shortness of breath, chest pain, abdominal pain, vomiting, Denies problems with periods, bowel movements, urination, or intercourse unless otherwise stated above.  Pertinent History Reviewed:  Reviewed past medical,surgical, social, obstetrical and family history.  Reviewed problem list, medications and allergies. Physical Assessment:   Vitals:   08/05/21 1418  BP: 129/82  Pulse: 92  Weight: 171 lb 6.4 oz (77.7 kg)  Height: 5\' 5"  (1.651 m)  Body mass index is 28.52 kg/m.       Physical Examination:   General appearance: alert, well appearing, and in no distress  Psych: mood appropriate, normal affect  Skin: warm & dry   Cardiovascular: RRR  Respiratory:  CTAB  Abdomen: soft, non-tender, no rebound, no guarding  Pelvic: examination not indicated  Extremities: no edema   Chaperone: N/A    Assessment & Plan:  1) Contraceptive management -reviewed plan for laparoscopic bilateral salpingectomy -discussed risk/benefit including but not limited to risk of bleeding, infection and injury -discussed "post tubal ligation syndrome" -discussed hospital expetations and recovery -Questions and concerns were addressed -plan to schedule for Aug 29- referral created -preop labs to be ordered  No orders of the defined types were placed in this encounter.   Return for preop appt Aug 9- ok for televisit.   , DO Attending Obstetrician & Gynecologist, [facility] for Aug 11, [facility]

## 2021-08-06 ENCOUNTER — Other Ambulatory Visit: Payer: Self-pay | Admitting: *Deleted

## 2021-08-06 MED ORDER — FLUCONAZOLE 150 MG PO TABS: 150.0000 mg | ORAL_TABLET | Freq: Once | ORAL | 0 refills | Status: AC

## 2021-08-20 ENCOUNTER — Encounter: Payer: Self-pay | Admitting: Obstetrics & Gynecology

## 2021-09-17 ENCOUNTER — Encounter: Payer: Self-pay | Admitting: Obstetrics & Gynecology

## 2021-10-03 ENCOUNTER — Ambulatory Visit (INDEPENDENT_AMBULATORY_CARE_PROVIDER_SITE_OTHER): Payer: Medicaid Other | Admitting: *Deleted

## 2021-10-03 ENCOUNTER — Ambulatory Visit: Payer: Medicaid Other

## 2021-10-03 DIAGNOSIS — Z3042 Encounter for surveillance of injectable contraceptive: Secondary | ICD-10-CM

## 2021-10-03 MED ORDER — MEDROXYPROGESTERONE ACETATE 150 MG/ML IM SUSP
150.0000 mg | Freq: Once | INTRAMUSCULAR | Status: AC
  Administered 2021-10-03: 150 mg via INTRAMUSCULAR

## 2021-10-03 NOTE — Progress Notes (Signed)
   NURSE VISIT- INJECTION  SUBJECTIVE:  Tracy Choi is a 36 y.o. G47P2002 female here for a Depo Provera for contraception/period management. She is a GYN patient.   OBJECTIVE:  There were no vitals taken for this visit.  Appears well, in no apparent distress  Injection administered in: Right deltoid  Meds ordered this encounter  Medications   medroxyPROGESTERone (DEPO-PROVERA) injection 150 mg    ASSESSMENT: GYN patient Depo Provera for contraception/period management PLAN: Follow-up: in 11-13 weeks for next Depo   Annamarie Dawley  10/03/2021 11:50 AM

## 2021-10-23 ENCOUNTER — Encounter: Payer: Self-pay | Admitting: Obstetrics & Gynecology

## 2021-11-03 NOTE — Patient Instructions (Addendum)
Tracy Choi  11/03/2021     @PREFPERIOPPHARMACY @   Your procedure is scheduled on  11/11/2021.   Report to [facility] at  0600  A.M.   Call this number if you have problems the morning of surgery:  [phone]   Remember:  Do not eat or drink after midnight.      Take these medicines the morning of surgery with A SIP OF WATER                                    xanax(if needed).     Do not wear jewelry, make-up or nail polish.  Do not wear lotions, powders, or perfumes, or deodorant.  Do not shave 48 hours prior to surgery.  Men may shave face and neck.  Do not bring valuables to the hospital.  [facility] is not responsible for any belongings or valuables.  Contacts, dentures or bridgework may not be worn into surgery.  Leave your suitcase in the car.  After surgery it may be brought to your room.  For patients admitted to the hospital, discharge time will be determined by your treatment team.  Patients discharged the day of surgery will not be allowed to drive home and must have someone with them for 24 hours.    Special instructions:   DO NOT smoke tobacco or vape for 24 hours before your procedure.  Please read over the following fact sheets that you were given. Coughing and Deep Breathing, Surgical Site Infection Prevention, Anesthesia Post-op Instructions, and Care and Recovery After Surgery       Salpingectomy, Care After The following information offers guidance on how to care for yourself after your procedure. Your health care provider may also give you more specific instructions. If you have problems or questions, contact your health care provider. What can I expect after the procedure? After the procedure, it is common to have: Pain in your abdomen. Light vaginal bleeding (spotting) for a few days. Tiredness. Your recovery time will depend on which method was used for your surgery. Follow these instructions at home: Medicines Take  over-the-counter and prescription medicines only as told by your health care provider. Ask your health care provider if the medicine prescribed to you: Requires you to avoid driving or using machinery. Can cause constipation. You may need to take actions to prevent or treat constipation, such as: Drink enough fluid to keep your urine pale yellow. Take over-the-counter or prescription medicines. Eat foods that are high in fiber, such as beans, whole grains, and fresh fruits and vegetables. Limit foods that are high in fat and processed sugars, such as fried or sweet foods. Incision care  Follow instructions from your health care provider about how to take care of your incision or incisions. Make sure you: Wash your hands with soap and water for at least 20 seconds before and after you change your bandage (dressing). If soap and water are not available, use hand sanitizer. Change or remove your dressing as told by your health care provider. Leave stitches (sutures), skin glue, staples, or adhesive strips in place. These skin closures may need to stay in place for 2 weeks or longer. If adhesive strip edges start to loosen and curl up, you may trim the loose edges. Do not remove adhesive strips completely unless your health care provider tells you to do that. Keep  your dressing clean and dry. Check your incision area every day for signs of infection. Check for: Redness, swelling, or pain that gets worse. Fluid or blood. Warmth. Pus or a bad smell. Activity Rest as told by your health care provider. Avoid sitting for a long time without moving. Get up to take short walks every 1-2 hours. This is important to improve blood flow and breathing. Ask for help if you feel weak or unsteady. Return to your normal activities as told by your health care provider. Ask your health care provider what activities are safe for you. Do not drive until your health care provider says that it is safe. Do not lift  anything that is heavier than 10 lb (4.5 kg), or the limit that you are told, until your health care provider says that it is safe. This may last for 2-6 weeks depending on your surgery. Do not douche, use tampons, or have sex until your health care provider approves. General instructions Do not use any products that contain nicotine or tobacco. These products include cigarettes, chewing tobacco, and vaping devices, such as e-cigarettes. These can delay healing after surgery. If you need help quitting, ask your health care provider. Wear compression stockings as told by your health care provider. These stockings help to prevent blood clots and reduce swelling in your legs. Do not take baths, swim, or use a hot tub until your health care provider approves. You may take showers. Keep all follow-up visits. This is important. Contact a health care provider if: You have pain when you urinate. You have redness, swelling, or more pain around an incision or an incision feels warm to the touch. You have pus, fluid, blood, or a bad smell coming from an incision or an incision starts to open. You have a fever. You have abdominal pain that gets worse or does not get better with medicine. You have a rash. You feel light-headed, have nausea and vomiting, or both. Get help right away if: You have pain in your chest or leg. You develop shortness of breath. You faint. You have increased or heavy vaginal bleeding, such as soaking a sanitary napkin in an hour. These symptoms may represent a serious problem that is an emergency. Do not wait to see if the symptoms will go away. Get medical help right away. Call your local emergency services (911 in the [place] Do not drive yourself to the hospital. Summary After the procedure, it is common to feel tired, have pain in your abdomen, and have light vaginal bleeding for a few days. Follow instructions from your health care provider about how to take care of your  incision or incisions. Return to your normal activities as told by your health care provider. Ask your health care provider what activities are safe for you. Do not douche, use tampons, or have sex until your health care provider approves. Keep all follow-up visits. This is important. This information is not intended to replace advice given to you by your health care provider. Make sure you discuss any questions you have with your health care provider. Document Revised: 01/23/2020 Document Reviewed: 01/23/2020 Elsevier Patient Education  [place] Anesthesia, Adult, Care After The following information offers guidance on how to care for yourself after your procedure. Your health care provider may also give you more specific instructions. If you have problems or questions, contact your health care provider. What can I expect after the procedure? After the procedure, it is common for people  to: Have pain or discomfort at the IV site. Have nausea or vomiting. Have a sore throat or hoarseness. Have trouble concentrating. Feel cold or chills. Feel weak, sleepy, or tired (fatigue). Have soreness and body aches. These can affect parts of the body that were not involved in surgery. Follow these instructions at home: For the time period you were told by your health care provider:  Rest. Do not participate in activities where you could fall or become injured. Do not drive or use machinery. Do not drink alcohol. Do not take sleeping pills or medicines that cause drowsiness. Do not make important decisions or sign legal documents. Do not take care of children on your own. General instructions Drink enough fluid to keep your urine pale yellow. If you have sleep apnea, surgery and certain medicines can increase your risk for breathing problems. Follow instructions from your health care provider about wearing your sleep device: Anytime you are sleeping, including during daytime  naps. While taking prescription pain medicines, sleeping medicines, or medicines that make you drowsy. Return to your normal activities as told by your health care provider. Ask your health care provider what activities are safe for you. Take over-the-counter and prescription medicines only as told by your health care provider. Do not use any products that contain nicotine or tobacco. These products include cigarettes, chewing tobacco, and vaping devices, such as e-cigarettes. These can delay incision healing after surgery. If you need help quitting, ask your health care provider. Contact a health care provider if: You have nausea or vomiting that does not get better with medicine. You vomit every time you eat or drink. You have pain that does not get better with medicine. You cannot urinate or have bloody urine. You develop a skin rash. You have a fever. Get help right away if: You have trouble breathing. You have chest pain. You vomit blood. These symptoms may be an emergency. Get help right away. Call 911. Do not wait to see if the symptoms will go away. Do not drive yourself to the hospital. Summary After the procedure, it is common to have a sore throat, hoarseness, nausea, vomiting, or to feel weak, sleepy, or fatigue. For the time period you were told by your health care provider, do not drive or use machinery. Get help right away if you have difficulty breathing, have chest pain, or vomit blood. These symptoms may be an emergency. This information is not intended to replace advice given to you by your health care provider. Make sure you discuss any questions you have with your health care provider. Document Revised: 05/30/2021 Document Reviewed: 05/30/2021 Elsevier Patient Education  [place]. How to Use Chlorhexidine Before Surgery Chlorhexidine gluconate (CHG) is a germ-killing (antiseptic) solution that is used to clean the skin. It can get rid of the bacteria that  normally live on the skin and can keep them away for about 24 hours. To clean your skin with CHG, you may be given: A CHG solution to use in the shower or as part of a sponge bath. A prepackaged cloth that contains CHG. Cleaning your skin with CHG may help lower the risk for infection: While you are staying in the intensive care unit of the hospital. If you have a vascular access, such as a central line, to provide short-term or long-term access to your veins. If you have a catheter to drain urine from your bladder. If you are on a ventilator. A ventilator is a machine that helps you  breathe by moving air in and out of your lungs. After surgery. What are the risks? Risks of using CHG include: A skin reaction. Hearing loss, if CHG gets in your ears and you have a perforated eardrum. Eye injury, if CHG gets in your eyes and is not rinsed out. The CHG product catching fire. Make sure that you avoid smoking and flames after applying CHG to your skin. Do not use CHG: If you have a chlorhexidine allergy or have previously reacted to chlorhexidine. On babies younger than 43 months of age. How to use CHG solution Use CHG only as told by your health care provider, and follow the instructions on the label. Use the full amount of CHG as directed. Usually, this is one bottle. During a shower Follow these steps when using CHG solution during a shower (unless your health care provider gives you different instructions): Start the shower. Use your normal soap and shampoo to wash your face and hair. Turn off the shower or move out of the shower stream. Pour the CHG onto a clean washcloth. Do not use any type of brush or rough-edged sponge. Starting at your neck, lather your body down to your toes. Make sure you follow these instructions: If you will be having surgery, pay special attention to the part of your body where you will be having surgery. Scrub this area for at least 1 minute. Do not use CHG on  your head or face. If the solution gets into your ears or eyes, rinse them well with water. Avoid your genital area. Avoid any areas of skin that have broken skin, cuts, or scrapes. Scrub your back and under your arms. Make sure to wash skin folds. Let the lather sit on your skin for 1-2 minutes or as long as told by your health care provider. Thoroughly rinse your entire body in the shower. Make sure that all body creases and crevices are rinsed well. Dry off with a clean towel. Do not put any substances on your body afterward--such as powder, lotion, or perfume--unless you are told to do so by your health care provider. Only use lotions that are recommended by the manufacturer. Put on clean clothes or pajamas. If it is the night before your surgery, sleep in clean sheets.  During a sponge bath Follow these steps when using CHG solution during a sponge bath (unless your health care provider gives you different instructions): Use your normal soap and shampoo to wash your face and hair. Pour the CHG onto a clean washcloth. Starting at your neck, lather your body down to your toes. Make sure you follow these instructions: If you will be having surgery, pay special attention to the part of your body where you will be having surgery. Scrub this area for at least 1 minute. Do not use CHG on your head or face. If the solution gets into your ears or eyes, rinse them well with water. Avoid your genital area. Avoid any areas of skin that have broken skin, cuts, or scrapes. Scrub your back and under your arms. Make sure to wash skin folds. Let the lather sit on your skin for 1-2 minutes or as long as told by your health care provider. Using a different clean, wet washcloth, thoroughly rinse your entire body. Make sure that all body creases and crevices are rinsed well. Dry off with a clean towel. Do not put any substances on your body afterward--such as powder, lotion, or perfume--unless you are told to  do  so by your health care provider. Only use lotions that are recommended by the manufacturer. Put on clean clothes or pajamas. If it is the night before your surgery, sleep in clean sheets. How to use CHG prepackaged cloths Only use CHG cloths as told by your health care provider, and follow the instructions on the label. Use the CHG cloth on clean, dry skin. Do not use the CHG cloth on your head or face unless your health care provider tells you to. When washing with the CHG cloth: Avoid your genital area. Avoid any areas of skin that have broken skin, cuts, or scrapes. Before surgery Follow these steps when using a CHG cloth to clean before surgery (unless your health care provider gives you different instructions): Using the CHG cloth, vigorously scrub the part of your body where you will be having surgery. Scrub using a back-and-forth motion for 3 minutes. The area on your body should be completely wet with CHG when you are done scrubbing. Do not rinse. Discard the cloth and let the area air-dry. Do not put any substances on the area afterward, such as powder, lotion, or perfume. Put on clean clothes or pajamas. If it is the night before your surgery, sleep in clean sheets.  For general bathing Follow these steps when using CHG cloths for general bathing (unless your health care provider gives you different instructions). Use a separate CHG cloth for each area of your body. Make sure you wash between any folds of skin and between your fingers and toes. Wash your body in the following order, switching to a new cloth after each step: The front of your neck, shoulders, and chest. Both of your arms, under your arms, and your hands. Your stomach and groin area, avoiding the genitals. Your right leg and foot. Your left leg and foot. The back of your neck, your back, and your buttocks. Do not rinse. Discard the cloth and let the area air-dry. Do not put any substances on your body  afterward--such as powder, lotion, or perfume--unless you are told to do so by your health care provider. Only use lotions that are recommended by the manufacturer. Put on clean clothes or pajamas. Contact a health care provider if: Your skin gets irritated after scrubbing. You have questions about using your solution or cloth. You swallow any chlorhexidine. Call your local poison control center ([phone] in the [place] Get help right away if: Your eyes itch badly, or they become very red or swollen. Your skin itches badly and is red or swollen. Your hearing changes. You have trouble seeing. You have swelling or tingling in your mouth or throat. You have trouble breathing. These symptoms may represent a serious problem that is an emergency. Do not wait to see if the symptoms will go away. Get medical help right away. Call your local emergency services (911 in the [place] Do not drive yourself to the hospital. Summary Chlorhexidine gluconate (CHG) is a germ-killing (antiseptic) solution that is used to clean the skin. Cleaning your skin with CHG may help to lower your risk for infection. You may be given CHG to use for bathing. It may be in a bottle or in a prepackaged cloth to use on your skin. Carefully follow your health care provider's instructions and the instructions on the product label. Do not use CHG if you have a chlorhexidine allergy. Contact your health care provider if your skin gets irritated after scrubbing. This information is not intended to replace advice given to  you by your health care provider. Make sure you discuss any questions you have with your health care provider. Document Revised: 06/30/2021 Document Reviewed: 05/13/2020 Elsevier Patient Education  [place].

## 2021-11-05 ENCOUNTER — Encounter (HOSPITAL_COMMUNITY)
Admission: RE | Admit: 2021-11-05 | Discharge: 2021-11-05 | Disposition: A | Discharge status: Home / Self Care | Payer: Medicaid Other | Source: Ambulatory Visit | Attending: Obstetrics & Gynecology | Admitting: Obstetrics & Gynecology

## 2021-11-05 DIAGNOSIS — Z302 Encounter for sterilization: Secondary | ICD-10-CM | POA: Insufficient documentation

## 2021-11-05 DIAGNOSIS — Z01818 Encounter for other preprocedural examination: Secondary | ICD-10-CM

## 2021-11-05 DIAGNOSIS — Z01812 Encounter for preprocedural laboratory examination: Secondary | ICD-10-CM | POA: Diagnosis present

## 2021-11-05 LAB — CBC
HCT: 44.8 % (ref 36.0–46.0)
Hemoglobin: 14.8 g/dL (ref 12.0–15.0)
MCH: 28.5 pg (ref 26.0–34.0)
MCHC: 33 g/dL (ref 30.0–36.0)
MCV: 86.2 fL (ref 80.0–100.0)
Platelets: 309 10*3/uL (ref 150–400)
RBC: 5.2 MIL/uL — ABNORMAL HIGH (ref 3.87–5.11)
RDW: 13.2 % (ref 11.5–15.5)
WBC: 10.7 10*3/uL — ABNORMAL HIGH (ref 4.0–10.5)
nRBC: 0 % (ref 0.0–0.2)

## 2021-11-05 LAB — PREGNANCY, URINE: Preg Test, Ur: NEGATIVE

## 2021-11-10 NOTE — H&P (Signed)
[facility] Attending History and Physical  Tracy Choi is a 36 y.o. [phone] who presents for scheduled laparoscopic bilateral salpingectomy  In review, pt does not desire another pregnancy and desires permanent sterilization.  Currently on Depot, no period with this form of contraception.    Denies any abnormal vaginal discharge, fevers, chills, sweats, dysuria, nausea, vomiting, other GI or GU symptoms or other general symptoms.  Past Medical History:  Diagnosis Date   Abnormal Pap smear    Anxiety    Asthma    Back pain 02/14/2013   Bronchitis    Depression    GERD (gastroesophageal reflux disease)    Herpes    do not let any family members know of the HSV   Menorrhagia 02/28/2013   Reflux 02/28/2013   Past Surgical History:  Procedure Laterality Date   WISDOM TOOTH EXTRACTION     OB History  Gravida Para Term Preterm AB Living  2 2 2  0 0 2  SAB IAB Ectopic Multiple Live Births  0 0 0 0 2    # Outcome Date GA Lbr Len/2nd Weight Sex Delivery Anes PTL Lv  2 Term 05/12/20 [redacted]w[redacted]d 01:50 / 00:10 3016 g M Vag-Spont EPI, Local  LIV     Birth Comments: none  1 Term 08/27/11 [redacted]w[redacted]d 09:01 / 00:27 3340 g M Vag-Spont EPI  LIV     Birth Comments: molding, but within normal limits  Patient denies any other pertinent gynecologic issues.  No current facility-administered medications on file prior to encounter.   Current Outpatient Medications on File Prior to Encounter  Medication Sig Dispense Refill   acyclovir (ZOVIRAX) 400 MG tablet Take 400 mg by mouth 2 (two) times daily as needed (cold sores).     albuterol (PROVENTIL) (2.5 MG/3ML) 0.083% nebulizer solution Take 2.5 mg by nebulization every 6 (six) hours as needed for wheezing or shortness of breath.     albuterol (VENTOLIN HFA) 108 (90 Base) MCG/ACT inhaler Inhale 2 puffs into the lungs every 6 (six) hours as needed for wheezing or shortness of breath. For asthmatic bronchitis     ALPRAZolam  (XANAX) 1 MG tablet Take 1 mg by mouth 3 (three) times daily as needed for anxiety.     diphenhydrAMINE (BENADRYL) 50 MG capsule Take 50 mg by mouth daily as needed for allergies.     fluticasone (FLONASE) 50 MCG/ACT nasal spray Place 1 spray into both nostrils daily as needed for allergies or rhinitis.     ibuprofen (ADVIL) 200 MG tablet Take 800 mg by mouth every 8 (eight) hours as needed for moderate pain.     medroxyPROGESTERone (DEPO-PROVERA) 150 MG/ML injection Inject 1 mL (150 mg total) into the muscle every 3 (three) months. 1 mL 3   Allergies  Allergen Reactions   Flagyl [Metronidazole] Hives and Shortness Of Breath   Peanut-Containing Drug Products Anaphylaxis   Vicodin [Hydrocodone-Acetaminophen] Nausea And Vomiting    Patient states that she gets sick to her stomach when medicine is "getting out of her system"    Social History:   reports that she has been smoking cigarettes. She has a 8.50 pack-year smoking history. She has quit using smokeless tobacco.  Her smokeless tobacco use included chew. She reports that she does not currently use drugs after having used the following drugs: Marijuana. She reports that she does not drink alcohol. Family History  Problem Relation Age of Onset   Cancer Mother    Alcohol abuse Mother  Hypertension Mother    Breast cancer Mother 93   Hypertension Maternal Aunt    Heart disease Maternal Aunt    Diabetes Maternal Uncle    Heart disease Maternal Uncle    Hypertension Maternal Uncle    Hypertension Paternal Aunt    Hypertension Paternal Uncle    Heart attack Paternal Uncle        has stents   Heart disease Maternal Grandmother    Hypertension Maternal Grandmother    Stroke Maternal Grandmother    Breast cancer Maternal Grandmother 60   Heart disease Maternal Grandfather    Hypertension Maternal Grandfather    Stroke Maternal Grandfather    Cancer Paternal Grandmother        leukemia   Hypertension Paternal Grandmother     Hypertension Paternal Grandfather    Heart murmur Son    Cataracts Son    Heart murmur Son    Anesthesia problems Neg Hx     Review of Systems: Pertinent items noted in HPI and remainder of comprehensive ROS otherwise negative.  PHYSICAL EXAM: BP 100/82   Pulse 64   Temp 97.9 F (36.6 C) (Oral)   Resp 12   Ht 5\' 5"  (1.651 m)   Wt 72.1 kg   SpO2 100%   BMI 26.46 kg/m   CONSTITUTIONAL: Well-developed, well-nourished female in no acute distress.  SKIN: Skin is warm and dry. No rash noted. Not diaphoretic. No erythema. No pallor. NEUROLOGIC: Alert and oriented to person, place, and time.  PSYCHIATRIC: Normal mood and affect. Normal behavior. Normal judgment and thought content. CARDIOVASCULAR: Normal heart rate noted, regular rhythm RESPIRATORY: Effort and breath sounds normal, no problems with respiration noted ABDOMEN: Soft, nontender, nondistended. PELVIC: deferred MUSCULOSKELETAL: no calf tenderness bilaterally EXT: no edema bilaterally, normal pulses  Labs: Results for orders placed or performed during the hospital encounter of 11/05/21 (from the past 336 hour(s))  CBC   Collection Time: 11/05/21  8:29 AM  Result Value Ref Range   WBC 10.7 (H) 4.0 - 10.5 K/uL   RBC 5.20 (H) 3.87 - 5.11 MIL/uL   Hemoglobin 14.8 12.0 - 15.0 g/dL   HCT 11/07/21 06.3 - 01.6 %   MCV 86.2 80.0 - 100.0 fL   MCH 28.5 26.0 - 34.0 pg   MCHC 33.0 30.0 - 36.0 g/dL   RDW 01.0 93.2 - 35.5 %   Platelets 309 150 - 400 K/uL   nRBC 0.0 0.0 - 0.2 %  Pregnancy, urine   Collection Time: 11/05/21  8:29 AM  Result Value Ref Range   Preg Test, Ur NEGATIVE NEGATIVE    Assessment: Desires sterilization   Plan: Laparoscopic bilateral salpingectomy -NPO -LR @ 125cc/hr -SCDs to OR -Risk/benefits and alternatives reviewed with the patient including but not limited to risk of bleeding, infection and injury to surrounding organs.  Questions and concerns were addressed and pt desires to proceed  11/07/21, DO Attending Obstetrician & Gynecologist, [facility] for [facility], [facility]

## 2021-11-11 ENCOUNTER — Ambulatory Visit (HOSPITAL_BASED_OUTPATIENT_CLINIC_OR_DEPARTMENT_OTHER): Payer: Medicaid Other | Admitting: Certified Registered"

## 2021-11-11 ENCOUNTER — Encounter (HOSPITAL_COMMUNITY)
Admission: RE | Disposition: A | Discharge status: Home / Self Care | Payer: Self-pay | Source: Home / Self Care | Attending: Obstetrics & Gynecology

## 2021-11-11 ENCOUNTER — Ambulatory Visit (HOSPITAL_COMMUNITY)
Admission: RE | Admit: 2021-11-11 | Discharge: 2021-11-11 | Disposition: A | Discharge status: Home / Self Care | Payer: Medicaid Other | Attending: Obstetrics & Gynecology | Admitting: Obstetrics & Gynecology

## 2021-11-11 ENCOUNTER — Encounter (HOSPITAL_COMMUNITY): Payer: Self-pay | Admitting: Obstetrics & Gynecology

## 2021-11-11 ENCOUNTER — Ambulatory Visit (HOSPITAL_COMMUNITY): Payer: Medicaid Other | Admitting: Certified Registered"

## 2021-11-11 ENCOUNTER — Other Ambulatory Visit: Payer: Self-pay

## 2021-11-11 DIAGNOSIS — Z302 Encounter for sterilization: Secondary | ICD-10-CM | POA: Diagnosis present

## 2021-11-11 DIAGNOSIS — Z01818 Encounter for other preprocedural examination: Secondary | ICD-10-CM

## 2021-11-11 DIAGNOSIS — F17210 Nicotine dependence, cigarettes, uncomplicated: Secondary | ICD-10-CM | POA: Insufficient documentation

## 2021-11-11 DIAGNOSIS — J45909 Unspecified asthma, uncomplicated: Secondary | ICD-10-CM | POA: Diagnosis not present

## 2021-11-11 DIAGNOSIS — K219 Gastro-esophageal reflux disease without esophagitis: Secondary | ICD-10-CM | POA: Diagnosis not present

## 2021-11-11 HISTORY — PX: LAPAROSCOPIC BILATERAL SALPINGECTOMY: SHX5889

## 2021-11-11 SURGERY — SALPINGECTOMY, BILATERAL, LAPAROSCOPIC
Anesthesia: General | Site: Abdomen

## 2021-11-11 MED ORDER — ORAL CARE MOUTH RINSE: 15.0000 mL | Freq: Once | OROMUCOSAL | Status: AC

## 2021-11-11 MED ORDER — BUPIVACAINE HCL (PF) 0.25 % IJ SOLN
INTRAMUSCULAR | Status: AC
  Filled 2021-11-11: qty 30

## 2021-11-11 MED ORDER — MIDAZOLAM HCL 2 MG/2ML IJ SOLN
INTRAMUSCULAR | Status: AC
  Filled 2021-11-11: qty 2

## 2021-11-11 MED ORDER — SUGAMMADEX SODIUM 200 MG/2ML IV SOLN
INTRAVENOUS | Status: DC | PRN
  Administered 2021-11-11: 200 mg via INTRAVENOUS

## 2021-11-11 MED ORDER — LIDOCAINE 2% (20 MG/ML) 5 ML SYRINGE
INTRAMUSCULAR | Status: DC | PRN
  Administered 2021-11-11: 80 mg via INTRAVENOUS

## 2021-11-11 MED ORDER — DEXAMETHASONE SODIUM PHOSPHATE 10 MG/ML IJ SOLN
INTRAMUSCULAR | Status: DC | PRN
  Administered 2021-11-11: 10 mg via INTRAVENOUS

## 2021-11-11 MED ORDER — MIDAZOLAM HCL 5 MG/5ML IJ SOLN
INTRAMUSCULAR | Status: DC | PRN
  Administered 2021-11-11: 2 mg via INTRAVENOUS

## 2021-11-11 MED ORDER — PHENYLEPHRINE 80 MCG/ML (10ML) SYRINGE FOR IV PUSH (FOR BLOOD PRESSURE SUPPORT)
PREFILLED_SYRINGE | INTRAVENOUS | Status: AC
  Filled 2021-11-11: qty 10

## 2021-11-11 MED ORDER — OXYCODONE-ACETAMINOPHEN 5-325 MG PO TABS: 1.0000 | ORAL_TABLET | Freq: Four times a day (QID) | ORAL | 0 refills | Status: AC | PRN

## 2021-11-11 MED ORDER — FENTANYL CITRATE (PF) 100 MCG/2ML IJ SOLN
INTRAMUSCULAR | Status: AC
  Filled 2021-11-11: qty 2

## 2021-11-11 MED ORDER — OXYCODONE HCL 5 MG PO TABS
5.0000 mg | ORAL_TABLET | Freq: Once | ORAL | Status: AC | PRN
  Administered 2021-11-11: 5 mg via ORAL
  Filled 2021-11-11: qty 1

## 2021-11-11 MED ORDER — FENTANYL CITRATE (PF) 100 MCG/2ML IJ SOLN
INTRAMUSCULAR | Status: DC | PRN
  Administered 2021-11-11 (×2): 50 ug via INTRAVENOUS

## 2021-11-11 MED ORDER — ROCURONIUM BROMIDE 10 MG/ML (PF) SYRINGE
PREFILLED_SYRINGE | INTRAVENOUS | Status: DC | PRN
  Administered 2021-11-11: 60 mg via INTRAVENOUS

## 2021-11-11 MED ORDER — DOCUSATE SODIUM 100 MG PO CAPS: 100.0000 mg | ORAL_CAPSULE | Freq: Two times a day (BID) | ORAL | 0 refills | Status: AC

## 2021-11-11 MED ORDER — SODIUM CHLORIDE 0.9 % IR SOLN
Status: DC | PRN
  Administered 2021-11-11: 1000 mL

## 2021-11-11 MED ORDER — PHENYLEPHRINE 80 MCG/ML (10ML) SYRINGE FOR IV PUSH (FOR BLOOD PRESSURE SUPPORT)
PREFILLED_SYRINGE | INTRAVENOUS | Status: DC | PRN
  Administered 2021-11-11: 160 ug via INTRAVENOUS

## 2021-11-11 MED ORDER — PROPOFOL 10 MG/ML IV BOLUS
INTRAVENOUS | Status: DC | PRN
  Administered 2021-11-11: 160 mg via INTRAVENOUS

## 2021-11-11 MED ORDER — LACTATED RINGERS IV SOLN: INTRAVENOUS | Status: DC

## 2021-11-11 MED ORDER — BUPIVACAINE HCL (PF) 0.25 % IJ SOLN
INTRAMUSCULAR | Status: DC | PRN
  Administered 2021-11-11: 30 mL

## 2021-11-11 MED ORDER — ROCURONIUM BROMIDE 10 MG/ML (PF) SYRINGE
PREFILLED_SYRINGE | INTRAVENOUS | Status: AC
  Filled 2021-11-11: qty 10

## 2021-11-11 MED ORDER — PROPOFOL 10 MG/ML IV BOLUS
INTRAVENOUS | Status: AC
  Filled 2021-11-11: qty 20

## 2021-11-11 MED ORDER — ONDANSETRON 4 MG PO TBDP: 4.0000 mg | ORAL_TABLET | Freq: Three times a day (TID) | ORAL | 0 refills | Status: DC | PRN

## 2021-11-11 MED ORDER — OXYCODONE HCL 5 MG/5ML PO SOLN: 5.0000 mg | Freq: Once | ORAL | Status: AC | PRN

## 2021-11-11 MED ORDER — ONDANSETRON HCL 4 MG/2ML IJ SOLN
INTRAMUSCULAR | Status: DC | PRN
  Administered 2021-11-11: 4 mg via INTRAVENOUS

## 2021-11-11 MED ORDER — ONDANSETRON HCL 4 MG/2ML IJ SOLN: 4.0000 mg | Freq: Once | INTRAMUSCULAR | Status: DC | PRN

## 2021-11-11 MED ORDER — FENTANYL CITRATE PF 50 MCG/ML IJ SOSY
25.0000 ug | PREFILLED_SYRINGE | INTRAMUSCULAR | Status: DC | PRN
  Administered 2021-11-11 (×2): 50 ug via INTRAVENOUS
  Filled 2021-11-11 (×2): qty 1

## 2021-11-11 MED ORDER — LIDOCAINE HCL (PF) 2 % IJ SOLN
INTRAMUSCULAR | Status: AC
  Filled 2021-11-11: qty 5

## 2021-11-11 MED ORDER — HYDROXYZINE HCL 25 MG PO TABS
25.0000 mg | ORAL_TABLET | Freq: Once | ORAL | Status: DC
  Filled 2021-11-11: qty 1

## 2021-11-11 MED ORDER — CHLORHEXIDINE GLUCONATE 0.12 % MT SOLN
15.0000 mL | Freq: Once | OROMUCOSAL | Status: AC
  Administered 2021-11-11: 15 mL via OROMUCOSAL

## 2021-11-11 SURGICAL SUPPLY — 40 items
ADH SKN CLS APL DERMABOND .7 (GAUZE/BANDAGES/DRESSINGS) ×1
APL ESCP 73.6OZ SRGCL (TIP)
BLADE SURG SZ11 CARB STEEL (BLADE) ×1 IMPLANT
CLOTH BEACON ORANGE TIMEOUT ST (SAFETY) ×1 IMPLANT
COVER SURGICAL LIGHT HANDLE (MISCELLANEOUS) ×2 IMPLANT
DERMABOND ADVANCED (GAUZE/BANDAGES/DRESSINGS) ×1
DERMABOND ADVANCED .7 DNX12 (GAUZE/BANDAGES/DRESSINGS) ×1 IMPLANT
DURAPREP 26ML APPLICATOR (WOUND CARE) ×1 IMPLANT
GAUZE 4X4 16PLY ~~LOC~~+RFID DBL (SPONGE) ×2 IMPLANT
GLOVE BIO SURGEON STRL SZ 6.5 (GLOVE) ×1 IMPLANT
GLOVE BIOGEL PI IND STRL 7.0 (GLOVE) ×3 IMPLANT
GLOVE BIOGEL PI INDICATOR 7.0 (GLOVE) ×3
GLOVE SURG LTX SZ6.5 (GLOVE) ×1 IMPLANT
GOWN STRL REUS W/ TWL LRG LVL3 (GOWN DISPOSABLE) ×2 IMPLANT
GOWN STRL REUS W/TWL LRG LVL3 (GOWN DISPOSABLE) ×2
KIT TURNOVER CYSTO (KITS) ×2 IMPLANT
NDL HYPO 25X1 1.5 SAFETY (NEEDLE) ×1 IMPLANT
NDL INSUFFLATION 14GA 120MM (NEEDLE) ×1 IMPLANT
NEEDLE HYPO 25X1 1.5 SAFETY (NEEDLE) ×1 IMPLANT
NEEDLE INSUFFLATION 14GA 120MM (NEEDLE) ×1 IMPLANT
NS IRRIG 1000ML POUR BTL (IV SOLUTION) IMPLANT
PACK PERI GYN (CUSTOM PROCEDURE TRAY) ×1 IMPLANT
PAD ARMBOARD 7.5X6 YLW CONV (MISCELLANEOUS) ×2 IMPLANT
SET BASIN LINEN APH (SET/KITS/TRAYS/PACK) ×1 IMPLANT
SET IRRIG TUBING LAPAROSCOPIC (IRRIGATION / IRRIGATOR) IMPLANT
SET TUBE SMOKE EVAC HIGH FLOW (TUBING) ×1 IMPLANT
SHEARS HARMONIC ACE PLUS 36CM (ENDOMECHANICALS) IMPLANT
SLEEVE Z-THREAD 5X100MM (TROCAR) ×1 IMPLANT
SOL ANTI FOG 6CC (MISCELLANEOUS) IMPLANT
SOLUTION ANTI FOG 6CC (MISCELLANEOUS) ×1
SUT MON AB 4-0 PS1 27 (SUTURE) ×1 IMPLANT
SUT VICRYL 0 UR6 27IN ABS (SUTURE) IMPLANT
SYR 10ML LL (SYRINGE) ×1 IMPLANT
SYR CONTROL 10ML LL (SYRINGE) ×1 IMPLANT
TIP ENDOSCOPIC SURGICEL (TIP) IMPLANT
TRAY FOLEY W/BAG SLVR 16FR (SET/KITS/TRAYS/PACK) ×1
TRAY FOLEY W/BAG SLVR 16FR ST (SET/KITS/TRAYS/PACK) ×1 IMPLANT
TROCAR KII 8X100ML NONTHREADED (TROCAR) ×1 IMPLANT
TROCAR Z-THREAD FIOS 5X100MM (TROCAR) ×1 IMPLANT
WARMER LAPAROSCOPE (MISCELLANEOUS) ×1 IMPLANT

## 2021-11-11 NOTE — Op Note (Signed)
PREOPERATIVE DIAGNOSIS:  Desires sterilization POSTOPERATIVE DIAGNOSIS: same PROCEDURE PERFORMED: Laparoscopic bilateral salpingectomy SURGEON: Dr. Myna Hidalgo ANESTHESIA: General endotracheal.  ESTIMATED BLOOD LOSS: minimal.  URINE OUTPUT: 100 cc of clear yellow urine at the end of the procedure.  IV FLUIDS: 1000 cc of crystalloid.  SPECIMEN(S): bilateral fallopian tubes COMPLICATIONS: None.  CONDITION: Stable.   FINDINGS: No ascites or peritoneal studding was appreciated.  Bowel appeared grossly normal.  Uterus normal size and shape.  Ovaries and tubes were normal in appearance.     Informed consent was obtained from the patient prior to taking her to the operating room where anesthesia was found to be adequate. She was placed in dorsal lithotomy position and examined under anesthesia. She was prepped and draped in normal sterile fashion. The bladder was catheterized with a foley under sterile technique.  A bi-valve speculum was then placed and the anterior lip of the cervix was grasped with the single tooth tenaculum. The hulka uterine manipulator was then advanced into the uterus to provide uterine mobility. The speculum and tenaculum were then removed.  Attention was then turned to the patients abdomen, 10cc of 0.25% Lidocaine was inserted.  A 5 mm infraumbilical skin incision was made with the scalpel. The veress needle was carefully introduced into the peritoneal cavity while tenting the abdominal wall. Intraperitoneal placement was confirmed by use of a saline-drop test.  The gas was connected and confirmed intrabdominal placement by a low initial pressure of . The abdomen was then insuflated with CO2 gas. The trocar and sleeve were then advanced without difficulty into the abdomen under direct visualization. Intraabdominal placement was confirmed by the laparoscope and surveillance of the abdomen was performed. Grossly normal appearing abdomen as mentioned in findings above.  Two  additional ports were placed.  Each area was injected with 10cc of local. A 51mm trocar on her right lower quadrant and an 43mm at her left lower quadrent.  Each trocar was placed under direct visualization.      The right fallopian tube was placed on tension and using the Harmonic, the tube was excised and removed through the port under direct visualization.  A similar procedure was performed on the left with removal of the fallopian tube.  Excellent hemostasis was noted.  The instruments were then removed from the patients abdomen with air allowed to fully escape. The left port site was closed with monocryl and all sites were closed with dermabond.  The manipulator was removed from the cervix with no lacerations or bleeding identified. The patient tolerated the procedure well with all sponge, lap, and needle counts correct. The patient was taken to recovery in stable condition. Foley was removed at the end of the procedure.  Myna Hidalgo, DO Attending Obstetrician & Gynecologist, [facility] for [employer], [facility]

## 2021-11-11 NOTE — Anesthesia Procedure Notes (Signed)
Procedure Name: Intubation Date/Time: 11/11/2021 7:37 AM  Performed by: Myna Bright, CRNAPre-anesthesia Checklist: Patient identified, Emergency Drugs available, Suction available and Patient being monitored Patient Re-evaluated:Patient Re-evaluated prior to induction Oxygen Delivery Method: Circle system utilized Preoxygenation: Pre-oxygenation with 100% oxygen Induction Type: IV induction Ventilation: Mask ventilation without difficulty Laryngoscope Size: Mac and 3 Grade View: Grade I Tube type: Oral Tube size: 7.0 mm Number of attempts: 1 Airway Equipment and Method: Stylet Placement Confirmation: ETT inserted through vocal cords under direct vision, positive ETCO2 and breath sounds checked- equal and bilateral Secured at: 21 cm Tube secured with: Tape Dental Injury: Teeth and Oropharynx as per pre-operative assessment

## 2021-11-11 NOTE — Discharge Instructions (Addendum)
HOME INSTRUCTIONS  Please note any unusual or excessive bleeding, pain, swelling. Mild dizziness or drowsiness are normal for about 24 hours after surgery.   Shower when comfortable  Restrictions: No driving for 24 hours or while taking pain medications.  Activity:  No heavy lifting (> 10 lbs), nothing in vagina (no tampons, douching, or intercourse) x 2 weeks; no tub baths for 2 weeks Vaginal spotting is expected but if your bleeding is heavy, period like,  please call the office   Incision: the bandaids will fall off when they are ready to; you may clean your incision with mild soap and water but do not rub or scrub the incision site.  You may experience slight bloody drainage from your incision periodically.  This is normal.  If you experience a large amount of drainage or the incision opens, please call your physician who will likely direct you to the emergency department.  Diet:  You may return to your regular diet.  Do not eat large meals.  Eat small frequent meals throughout the day.  Continue to drink a good amount of water at least 6-8 glasses of water per day, hydration is very important for the healing process.  Pain Management: Take Ibuprofen every 6 hours with food as needed for pain.  For moderate to severe pain, a prescription of percocet has been sent in.  You can alternate this medication with the ibuprofen.   -If the Percocet is too strong, switch to over the counter tylenol instead.   -Do not take Percocet (oxycodone/acetaminophen) and Tylenol (acetaminophen) together (percocet has tylenol in it).  Always take prescription pain medication with food.  Percocet may cause constipation, you may want to take a stool softener while taking this medication.  A prescription of colace has been sent in to take twice daily if needed while taking the oxycodone.  Be sure to drink plenty of fluids and increase your fiber to help with constipation.  Alcohol -- Avoid for 24 hours and while  taking pain medications.  Nausea: Take sips of ginger ale or soda.  A prescription of zofran(ondansetron) has also been sent in to take if needed  Fever -- Call physician if temperature over 101 degrees  Follow up:  If you do not already have a follow up appointment scheduled, please call the office at [phone].  If you experience fever (a temperature greater than 100.4), pain unrelieved by pain medication, shortness of breath, swelling of a single leg, or any other symptoms which are concerning to you please the office immediately.

## 2021-11-11 NOTE — Transfer of Care (Signed)
Immediate Anesthesia Transfer of Care Note  Patient: CALLI BASHOR  Procedure(s) Performed: LAPAROSCOPIC BILATERAL SALPINGECTOMY (Abdomen)  Patient Location: PACU  Anesthesia Type:General  Level of Consciousness: awake, alert , oriented and patient cooperative  Airway & Oxygen Therapy: Patient Spontanous Breathing  Post-op Assessment: Report given to RN, Post -op Vital signs reviewed and stable and Patient moving all extremities  Post vital signs: Reviewed and stable  Last Vitals:  Vitals Value Taken Time  BP 141/94 11/11/21 0831  Temp 36.4 C 11/11/21 0830  Pulse 67 11/11/21 0835  Resp 24 11/11/21 0835  SpO2 100 % 11/11/21 0835  Vitals shown include unvalidated device data.  Last Pain:  Vitals:   11/11/21 0658  TempSrc: Oral  PainSc: 0-No pain      Patients Stated Pain Goal: 9 (11/11/21 8299)  Complications: No notable events documented.

## 2021-11-11 NOTE — Anesthesia Preprocedure Evaluation (Signed)
Anesthesia Evaluation  Patient identified by MRN, date of birth, ID band Patient awake    Reviewed: Allergy & Precautions, H&P , NPO status , Patient's Chart, lab work & pertinent test results, reviewed documented beta blocker date and time   Airway Mallampati: II  TM Distance: >3 FB Neck ROM: full    Dental no notable dental hx.    Pulmonary asthma , Current Smoker,    Pulmonary exam normal breath sounds clear to auscultation       Cardiovascular Exercise Tolerance: Good negative cardio ROS   Rhythm:regular Rate:Normal     Neuro/Psych PSYCHIATRIC DISORDERS Anxiety Depression negative neurological ROS     GI/Hepatic Neg liver ROS, GERD  Medicated,  Endo/Other  negative endocrine ROS  Renal/GU negative Renal ROS  negative genitourinary   Musculoskeletal   Abdominal   Peds  Hematology negative hematology ROS (+)   Anesthesia Other Findings   Reproductive/Obstetrics negative OB ROS                             Anesthesia Physical Anesthesia Plan  ASA: 2  Anesthesia Plan: General and General ETT   Post-op Pain Management:    Induction:   PONV Risk Score and Plan: Ondansetron  Airway Management Planned:   Additional Equipment:   Intra-op Plan:   Post-operative Plan:   Informed Consent: I have reviewed the patients History and Physical, chart, labs and discussed the procedure including the risks, benefits and alternatives for the proposed anesthesia with the patient or authorized representative who has indicated his/her understanding and acceptance.     Dental Advisory Given  Plan Discussed with: CRNA  Anesthesia Plan Comments:         Anesthesia Quick Evaluation

## 2021-11-12 LAB — SURGICAL PATHOLOGY

## 2021-11-12 NOTE — Anesthesia Postprocedure Evaluation (Signed)
Anesthesia Post Note  Patient: Tracy Choi  Procedure(s) Performed: LAPAROSCOPIC BILATERAL SALPINGECTOMY (Abdomen)  Patient location during evaluation: Phase II Anesthesia Type: General Level of consciousness: awake Pain management: pain level controlled Vital Signs Assessment: post-procedure vital signs reviewed and stable Respiratory status: spontaneous breathing and respiratory function stable Cardiovascular status: blood pressure returned to baseline and stable Postop Assessment: no headache and no apparent nausea or vomiting Anesthetic complications: no Comments: Late entry   No notable events documented.   Last Vitals:  Vitals:   11/11/21 0930 11/11/21 0945  BP: 112/76 116/74  Pulse: (!) 56 (!) 46  Resp: (!) 25 (!) 22  Temp:  (!) 36.4 C  SpO2: 98% 99%    Last Pain:  Vitals:   11/11/21 0945  TempSrc: Oral  PainSc: 5                  Windell Norfolk

## 2021-11-14 ENCOUNTER — Encounter (HOSPITAL_COMMUNITY): Payer: Self-pay | Admitting: Obstetrics & Gynecology

## 2021-11-21 ENCOUNTER — Encounter: Payer: Self-pay | Admitting: Obstetrics & Gynecology

## 2021-11-21 ENCOUNTER — Ambulatory Visit (INDEPENDENT_AMBULATORY_CARE_PROVIDER_SITE_OTHER): Payer: Medicaid Other | Admitting: Obstetrics & Gynecology

## 2021-11-21 VITALS — BP 122/81 | HR 72 | Ht 65.0 in | Wt 155.8 lb

## 2021-11-21 DIAGNOSIS — Z4889 Encounter for other specified surgical aftercare: Secondary | ICD-10-CM

## 2021-11-21 NOTE — Progress Notes (Signed)
    PostOp Visit Note  Tracy Choi is a 36 y.o. G8P2002 female who presents for a postoperative visit. She is 1 week postop following a LSC Bilateral salpingectomy completed on 8/29   Today she notes that she is doing well in fact she thinks she over did it yesterday, but overall doing well.  Not taking oxycodone, pain minimal. Denies fever or chills.  Tolerating gen diet.  +Flatus, Regular BMs.  Overall doing well and reports no acute complaints   Review of Systems Pertinent items are noted in HPI.    Objective:  BP 122/81 (BP Location: Right Arm, Patient Position: Sitting, Cuff Size: Normal)   Pulse 72   Ht 5\' 5"  (1.651 m)   Wt 155 lb 12.8 oz (70.7 kg)   Breastfeeding No   BMI 25.93 kg/m    Physical Examination:  GENERAL ASSESSMENT: well developed and well nourished SKIN: normal color, no lesions CHEST: normal air exchange, respiratory effort normal with no retractions HEART: regular rate and rhythm ABDOMEN: soft, non-distended, +BS INCISION: well healed- C/D/I EXTREMITY: no edema PSYCH: mood appropriate, normal affect       Assessment:    Postop check   Plan:   Meeting postop milestones appropriately F/u prn or in Feb for annual  Mar, DO Attending Obstetrician & Gynecologist, [facility] for [facility]idalgo, [facility]

## 2021-12-25 ENCOUNTER — Ambulatory Visit: Payer: Medicaid Other

## 2021-12-26 ENCOUNTER — Ambulatory Visit: Payer: Medicaid Other

## 2022-12-09 ENCOUNTER — Ambulatory Visit: Payer: Medicaid Other | Admitting: Women's Health

## 2022-12-09 ENCOUNTER — Encounter: Payer: Self-pay | Admitting: Women's Health

## 2022-12-09 ENCOUNTER — Other Ambulatory Visit (HOSPITAL_COMMUNITY)
Admission: RE | Admit: 2022-12-09 | Discharge: 2022-12-09 | Disposition: A | Discharge status: Home / Self Care | Payer: Medicaid Other | Source: Ambulatory Visit | Attending: Women's Health | Admitting: Women's Health

## 2022-12-09 VITALS — BP 127/88 | HR 68 | Ht 65.0 in | Wt 164.4 lb

## 2022-12-09 DIAGNOSIS — Z01419 Encounter for gynecological examination (general) (routine) without abnormal findings: Secondary | ICD-10-CM

## 2022-12-09 NOTE — Progress Notes (Signed)
WELL-WOMAN EXAMINATION Patient name: Tracy Choi MRN [id number]  Date of birth: January 27, 1986 Chief Complaint:   Gynecologic Exam  History of Present Illness:   Tracy Choi is a 37 y.o. G45P2002 Caucasian female being seen today for a routine well-woman exam.  Current complaints: none  PCP: Robbie Lis      does not desire labs Patient's last menstrual period was 12/04/2022. The current method of family planning is tubal ligation.  Last pap 05/12/19. Results were: NILM w/ HRHPV negative. H/O abnormal pap: no Last mammogram: never. Results were: N/A. Family h/o breast cancer: no Last colonoscopy: never. Results were: N/A. Family h/o colorectal cancer: no     12/09/2022    1:37 PM 06/24/2021   11:14 AM 02/22/2020    9:18 AM 11/13/2019    9:21 AM 05/12/2019   12:11 PM  Depression screen PHQ 2/9  Decreased Interest 1 0 1 0 0  Down, Depressed, Hopeless 1 0 1 0 0  PHQ - 2 Score 2 0 2 0 0  Altered sleeping 2 1 1 3    Tired, decreased energy 3 1 0 3   Change in appetite 2 0 0 0   Feeling bad or failure about yourself  0 0 0 0   Trouble concentrating 3 0 0 0   Moving slowly or fidgety/restless 0 0 0 0   Suicidal thoughts 0 0 0 0   PHQ-9 Score 12 2 3 6    Difficult doing work/chores    Not difficult at all         12/09/2022    1:39 PM 06/24/2021   11:14 AM 02/22/2020    9:19 AM 11/13/2019    9:22 AM  GAD 7 : Generalized Anxiety Score  Nervous, Anxious, on Edge 2 1 0 3  Control/stop worrying 2 1 1 3   Worry too much - different things 2 1 0 3  Trouble relaxing 2 0 1 2  Restless 1 0 1 0  Easily annoyed or irritable 3 0 1 3  Afraid - awful might happen 1 0 1 0  Total GAD 7 Score 13 3 5 14      Review of Systems:   Pertinent items are noted in HPI Denies any headaches, blurred vision, fatigue, shortness of breath, chest pain, abdominal pain, abnormal vaginal discharge/itching/odor/irritation, problems with periods, bowel movements, urination, or intercourse unless otherwise  stated above. Pertinent History Reviewed:  Reviewed past medical,surgical, social and family history.  Reviewed problem list, medications and allergies. Physical Assessment:   Vitals:   12/09/22 1335  BP: 127/88  Pulse: 68  Weight: 164 lb 6.4 oz (74.6 kg)  Height: 5\' 5"  (1.651 m)  Body mass index is 27.36 kg/m.        Physical Examination:   General appearance - well appearing, and in no distress  Mental status - alert, oriented to person, place, and time  Psych:  She has a normal mood and affect  Skin - warm and dry, normal color, no suspicious lesions noted  Chest - effort normal, all lung fields clear to auscultation bilaterally  Heart - normal rate and regular rhythm  Neck:  midline trachea, no thyromegaly or nodules  Breasts - breasts appear normal, no suspicious masses, no skin or nipple changes or  axillary nodes  Abdomen - soft, nontender, nondistended, no masses or organomegaly  Pelvic - VULVA: normal appearing vulva with no masses, tenderness or lesions  VAGINA: normal appearing vagina with normal color and discharge, no lesions  CERVIX: normal appearing cervix without discharge or lesions, no CMT  Thin prep pap is done w/ HR HPV cotesting  UTERUS: uterus is felt to be normal size, shape, consistency and nontender   ADNEXA: No adnexal masses or tenderness noted.  Extremities:  No swelling or varicosities noted  Chaperone: Faith Rogue    No results found for this or any previous visit (from the past 24 hour(s)).  Assessment & Plan:  1) Well-Woman Exam  Labs/procedures today: pap  Mammogram: @ 35yo, or sooner if problems Colonoscopy: @ 35yo, or sooner if problems  No orders of the defined types were placed in this encounter.   Meds: No orders of the defined types were placed in this encounter.   Follow-up: Return in about 1 year (around 12/09/2023) for Physical.  Cheral Marker CNM, WHNP-BC 12/09/2022 2:02 PM

## 2022-12-14 LAB — CYTOLOGY - PAP
Chlamydia: NEGATIVE
Comment: NEGATIVE
Comment: NEGATIVE
Comment: NORMAL
Diagnosis: NEGATIVE
High risk HPV: NEGATIVE
Neisseria Gonorrhea: NEGATIVE

## 2023-06-13 NOTE — ED Provider Notes (Signed)
 Chief complaint:  Chief Complaint  Patient presents with  . Rash    History is obtained from patient  HPI: This is a 38 y.o. female patient is concerned about a rash on her hands, arms, neck, legs and throat.  Patient states that she has had this rash for 3 days.  She thinks it might be from poison oak or ivy.  Review of Systems  All other systems reviewed and are negative.   Allergies: is allergic to metronidazole  hcl, peanut, and hydrocodone -acetaminophen . Medications: is not on any long-term medications. PMHx:  has a past medical history of Asthma. PSHx:  has no past surgical history on file. SocHx:   Allergies, Medications, Medical, Surgical, and Social History were reviewed as documented above.  Physical Exam Vitals and nursing note reviewed.  Constitutional:      General: She is not in acute distress. HENT:     Head: Normocephalic and atraumatic.     Nose: Nose normal.     Mouth/Throat:     Mouth: Mucous membranes are moist.  Eyes:     Extraocular Movements: Extraocular movements intact.     Pupils: Pupils are equal, round, and reactive to light.  Cardiovascular:     Rate and Rhythm: Normal rate and regular rhythm.     Pulses: Normal pulses.     Heart sounds: Normal heart sounds.  Pulmonary:     Effort: Pulmonary effort is normal.     Breath sounds: Normal breath sounds.  Abdominal:     General: Abdomen is flat.     Palpations: Abdomen is soft.  Musculoskeletal:        General: No deformity.  Lymphadenopathy:     Cervical: No cervical adenopathy.  Skin:    General: Skin is warm and dry.     Capillary Refill: Capillary refill takes less than 2 seconds.     Findings: Rash present.  Neurological:     General: No focal deficit present.     Mental Status: She is alert. Mental status is at baseline.  Psychiatric:        Mood and Affect: Mood normal.        Behavior: Behavior normal.    Vitals: BP 135/87   Pulse 95   Temp 36.3 C (97.4 F) (Temporal)    Resp 17   Ht 165.1 cm (5' 5)   Wt 76.4 kg (168 lb 6.4 oz)   SpO2 100%   BMI 28.02 kg/m   ED Results No results found for any visits on 06/13/23. No results found.  Medications Administered:  Medications  dexAMETHasone  (DECADRON ) tablet 6 mg (6 mg Oral Given 06/13/23 0846)    Procedures  The results of any testing as well as any treatment done today in the Emergency Department were communicated to the patient and/or their family/caregiver (if present). The patient/family/caregiver verbalized understanding and compliance with the treatment plan and reasons to return immediately to the ED. All of their questions were answered at bedside  Medical Decision Making:   Medical Decision Making The patient presents with rash typical of Toxicodendron dermatitis.  The exposure did not just occur and decontamination in the ED was not attempted. Patient was given the initial dose of steroid in the ED today.  There was no evidence of respiratory distress, oropharyngeal swelling or other organ involvement. The patient has no evidence of anaphylaxis at this time.  Patient's symptoms not typical for other emergent causes of rash such as cellulitis, necrotizing fasciitis, vasculitis, Erythema migrans, Erythema  multiforme, anaphylaxis, SJS, TEN, EM, RMSF, Bullous pemphigoid, pemphigus vulgaris, Monkey Pox, Chicken pox, Shingles, HSV, Tularemia, anthrax or meningitis or a manifestation of any other systemic pathology at this time. I do not believe laboratory and radiologic studies are warranted at this time. I have discussed this with patient who states understanding and agreement.  Prescriptions were provided for an extended prednisone taper. I also recommended antihistamines and calamine lotion as well as strict return precautions at the immediate onset of any other organ system involvement or respiratory distress.  I have reviewed my clinical findings and my clinical impression with the patient. The  patient has expressed understanding and agreement with plan. They understand at this time there is no evidence for a more serious underlying process, but also understands that early in the process of a condition such as this, an initial workup can be falsely reassuring. I have counseled and discussed follow-up with the patient, stressing the importance of appropriate follow-up. I have also counseled the patient to return if symptoms worsen or if there are any concerns. Routine discharge counseling was given to the patient and the patient understands that worsening, changing or persistent symptoms should prompt an immediate call or follow up with their primary physician or return to the emergency department for reevaluation. Patient has expressed understanding.       Diagnosis:  1. Toxicodendron dermatitis    Condition: Stable Disposition: Discharge   This chart has been completed using Engineer, civil (consulting) software, and while attempts have been made to ensure accuracy, certain words and phrases may not be transcribed as intended.     Moody Mar, [place] 06/13/23 9092

## 2023-12-14 ENCOUNTER — Encounter: Payer: Self-pay | Admitting: Women's Health

## 2023-12-14 ENCOUNTER — Other Ambulatory Visit (HOSPITAL_COMMUNITY)
Admission: RE | Admit: 2023-12-14 | Discharge: 2023-12-14 | Disposition: A | Discharge status: Home / Self Care | Source: Ambulatory Visit | Attending: Women's Health | Admitting: Women's Health

## 2023-12-14 ENCOUNTER — Ambulatory Visit (INDEPENDENT_AMBULATORY_CARE_PROVIDER_SITE_OTHER): Admitting: Women's Health

## 2023-12-14 VITALS — BP 117/79 | HR 75 | Ht 65.0 in | Wt 162.0 lb

## 2023-12-14 DIAGNOSIS — N898 Other specified noninflammatory disorders of vagina: Secondary | ICD-10-CM

## 2023-12-14 DIAGNOSIS — Z01419 Encounter for gynecological examination (general) (routine) without abnormal findings: Secondary | ICD-10-CM

## 2023-12-14 NOTE — Progress Notes (Signed)
 WELL-WOMAN EXAMINATION Patient name: Tracy Choi MRN [id number]  Date of birth: Feb 07, 1986 Chief Complaint:   Gynecologic Exam  History of Present Illness:   Tracy Choi is a 38 y.o. G51P2002 Caucasian female being seen today for a routine well-woman exam. Bump Lt armpit x 43yr, no changes.  Current complaints: vaginal odor, no itching/irritation/discharge  PCP: [facility]      Patient's last menstrual period was 11/15/2023. The current method of family planning is BTS.  Last pap 12/09/22. Results were: NILM w/ HRHPV negative. H/O abnormal pap: no Last mammogram: never. Results were: N/A. Family h/o breast cancer: yes MGM Last colonoscopy: never. Results were: N/A. Family h/o colorectal cancer: no     12/14/2023   10:22 AM 12/09/2022    1:37 PM 06/24/2021   11:14 AM 02/22/2020    9:18 AM 11/13/2019    9:21 AM  Depression screen PHQ 2/9  Decreased Interest 1 1 0 1 0  Down, Depressed, Hopeless 0 1 0 1 0  PHQ - 2 Score 1 2 0 2 0  Altered sleeping 3 2 1 1 3   Tired, decreased energy 2 3 1  0 3  Change in appetite 2 2 0 0 0  Feeling bad or failure about yourself  0 0 0 0 0  Trouble concentrating 3 3 0 0 0  Moving slowly or fidgety/restless 0 0 0 0 0  Suicidal thoughts 0 0 0 0 0  PHQ-9 Score 11 12 2 3 6   Difficult doing work/chores     Not difficult at all        12/14/2023   10:23 AM 12/09/2022    1:39 PM 06/24/2021   11:14 AM 02/22/2020    9:19 AM  GAD 7 : Generalized Anxiety Score  Nervous, Anxious, on Edge 3 2 1  0  Control/stop worrying 3 2 1 1   Worry too much - different things 3 2 1  0  Trouble relaxing 3 2 0 1  Restless 3 1 0 1  Easily annoyed or irritable 1 3 0 1  Afraid - awful might happen 1 1 0 1  Total GAD 7 Score 17 13 3 5      Review of Systems:   Pertinent items are noted in HPI Denies any headaches, blurred vision, fatigue, shortness of breath, chest pain, abdominal pain, abnormal vaginal discharge/itching/odor/irritation, problems with periods,  bowel movements, urination, or intercourse unless otherwise stated above. Pertinent History Reviewed:  Reviewed past medical,surgical, social and family history.  Reviewed problem list, medications and allergies. Physical Assessment:   Vitals:   12/14/23 1022  BP: 117/79  Pulse: 75  Weight: 162 lb (73.5 kg)  Height: 5' 5 (1.651 m)  Body mass index is 26.96 kg/m.        Physical Examination:   General appearance - well appearing, and in no distress  Mental status - alert, oriented to person, place, and time  Psych:  She has a normal mood and affect  Skin - warm and dry, normal color, no suspicious lesions noted  Chest - effort normal, all lung fields clear to auscultation bilaterally  Heart - normal rate and regular rhythm  Neck:  midline trachea, no thyromegaly or nodules  Breasts - breasts appear normal, no suspicious masses, no skin or nipple changes or  axillary nodes, ~0.5cm firm round mobile nontender ?cyst Lt upper outer axilla (no changes x 70yr per pt)  Abdomen - soft, nontender, nondistended, no masses or organomegaly  Pelvic - VULVA: normal appearing  vulva with no masses, tenderness or lesions  VAGINA: normal appearing vagina with normal color and discharge, no lesions  CERVIX: normal appearing cervix without discharge or lesions, no CMT  Thin prep pap is not done   UTERUS: uterus is felt to be normal size, shape, consistency and nontender   ADNEXA: No adnexal masses or tenderness noted.  Extremities:  No swelling or varicosities noted  Chaperone: Alan Fischer  No results found for this or any previous visit (from the past 24 hours).  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 Keep monitoring, if any changes let us  know  3) Vaginal odor> CV swab (allergic to metronidazole )  Labs/procedures today: CV swab  Mammogram: @ 36yo, or sooner if problems Colonoscopy: @  36yo, or sooner if problems  No orders of the defined types were placed in this encounter.   Meds: No orders of the defined types were placed in this encounter.   Follow-up: Return in about 1 year (around 12/13/2024) for Physical.  Suzen JONELLE Fetters CNM, WHNP-BC 12/14/2023 10:41 AM

## 2023-12-16 ENCOUNTER — Ambulatory Visit: Payer: Self-pay | Admitting: Women's Health

## 2023-12-16 LAB — CERVICOVAGINAL ANCILLARY ONLY
Bacterial Vaginitis (gardnerella): POSITIVE — AB
Candida Glabrata: NEGATIVE
Candida Vaginitis: NEGATIVE
Chlamydia: NEGATIVE
Comment: NEGATIVE
Comment: NEGATIVE
Comment: NEGATIVE
Comment: NEGATIVE
Comment: NEGATIVE
Comment: NORMAL
Neisseria Gonorrhea: NEGATIVE
Trichomonas: NEGATIVE

## 2023-12-16 MED ORDER — BORIC ACID VAGINAL 600 MG VA SUPP: 600.0000 mg | Freq: Every day | VAGINAL | 0 refills | Status: AC

## 2024-10-02 ENCOUNTER — Ambulatory Visit: Admitting: Physician Assistant
